# Patient Record
Sex: Female | Born: 1940 | Race: White | Hispanic: No | Marital: Married | Smoking: Never smoker
Health system: Southern US, Community
[De-identification: ages and names within clinical notes are randomized; demographics above are authoritative.]

## PROBLEM LIST (undated history)

## (undated) DIAGNOSIS — C439 Malignant melanoma of skin, unspecified: Secondary | ICD-10-CM

## (undated) DIAGNOSIS — M1990 Unspecified osteoarthritis, unspecified site: Secondary | ICD-10-CM

## (undated) DIAGNOSIS — I2699 Other pulmonary embolism without acute cor pulmonale: Secondary | ICD-10-CM

## (undated) DIAGNOSIS — Z87442 Personal history of urinary calculi: Secondary | ICD-10-CM

## (undated) DIAGNOSIS — I10 Essential (primary) hypertension: Secondary | ICD-10-CM

## (undated) DIAGNOSIS — D6851 Activated protein C resistance: Secondary | ICD-10-CM

## (undated) HISTORY — PX: MELANOMA EXCISION: SHX5266

## (undated) HISTORY — PX: FRACTURE SURGERY: SHX138

## (undated) HISTORY — PX: ANKLE FRACTURE SURGERY: SHX122

## (undated) HISTORY — PX: CYSTOSCOPY W/ STONE MANIPULATION: SHX1427

## (undated) HISTORY — DX: Other pulmonary embolism without acute cor pulmonale: I26.99

## (undated) HISTORY — PX: JOINT REPLACEMENT: SHX530

---

## 1998-03-16 ENCOUNTER — Other Ambulatory Visit: Admission: RE | Admit: 1998-03-16 | Discharge: 1998-03-16 | Payer: Self-pay | Admitting: *Deleted

## 1998-04-13 ENCOUNTER — Ambulatory Visit (HOSPITAL_COMMUNITY): Admission: RE | Admit: 1998-04-13 | Discharge: 1998-04-13 | Payer: Self-pay | Admitting: *Deleted

## 1998-06-10 ENCOUNTER — Encounter: Payer: Self-pay | Admitting: *Deleted

## 1998-06-10 ENCOUNTER — Encounter: Payer: Self-pay | Admitting: Emergency Medicine

## 1998-06-10 ENCOUNTER — Inpatient Hospital Stay (HOSPITAL_COMMUNITY): Admission: EM | Admit: 1998-06-10 | Discharge: 1998-06-11 | Payer: Self-pay | Admitting: Emergency Medicine

## 1999-03-22 ENCOUNTER — Other Ambulatory Visit: Admission: RE | Admit: 1999-03-22 | Discharge: 1999-03-22 | Payer: Self-pay | Admitting: *Deleted

## 1999-04-17 ENCOUNTER — Ambulatory Visit (HOSPITAL_COMMUNITY): Admission: RE | Admit: 1999-04-17 | Discharge: 1999-04-17 | Payer: Self-pay | Admitting: *Deleted

## 2000-04-17 ENCOUNTER — Ambulatory Visit (HOSPITAL_COMMUNITY): Admission: RE | Admit: 2000-04-17 | Discharge: 2000-04-17 | Payer: Self-pay | Admitting: *Deleted

## 2000-04-24 ENCOUNTER — Other Ambulatory Visit: Admission: RE | Admit: 2000-04-24 | Discharge: 2000-04-24 | Payer: Self-pay | Admitting: *Deleted

## 2001-05-05 ENCOUNTER — Ambulatory Visit (HOSPITAL_COMMUNITY): Admission: RE | Admit: 2001-05-05 | Discharge: 2001-05-05 | Payer: Self-pay | Admitting: *Deleted

## 2001-05-07 ENCOUNTER — Other Ambulatory Visit: Admission: RE | Admit: 2001-05-07 | Discharge: 2001-05-07 | Payer: Self-pay | Admitting: *Deleted

## 2001-07-08 HISTORY — PX: ANKLE FRACTURE SURGERY: SHX122

## 2001-12-06 ENCOUNTER — Emergency Department (HOSPITAL_COMMUNITY): Admission: EM | Admit: 2001-12-06 | Discharge: 2001-12-06 | Payer: Self-pay | Admitting: Emergency Medicine

## 2001-12-06 ENCOUNTER — Encounter: Payer: Self-pay | Admitting: Emergency Medicine

## 2001-12-09 ENCOUNTER — Ambulatory Visit (HOSPITAL_BASED_OUTPATIENT_CLINIC_OR_DEPARTMENT_OTHER): Admission: RE | Admit: 2001-12-09 | Discharge: 2001-12-10 | Payer: Self-pay | Admitting: Orthopedic Surgery

## 2001-12-09 ENCOUNTER — Encounter: Payer: Self-pay | Admitting: Orthopedic Surgery

## 2002-04-05 ENCOUNTER — Encounter: Admission: RE | Admit: 2002-04-05 | Discharge: 2002-04-05 | Payer: Self-pay | Admitting: Internal Medicine

## 2002-04-05 ENCOUNTER — Encounter: Payer: Self-pay | Admitting: Internal Medicine

## 2002-05-06 ENCOUNTER — Ambulatory Visit (HOSPITAL_COMMUNITY): Admission: RE | Admit: 2002-05-06 | Discharge: 2002-05-06 | Payer: Self-pay | Admitting: *Deleted

## 2002-05-13 ENCOUNTER — Other Ambulatory Visit: Admission: RE | Admit: 2002-05-13 | Discharge: 2002-05-13 | Payer: Self-pay | Admitting: *Deleted

## 2003-05-09 ENCOUNTER — Ambulatory Visit (HOSPITAL_COMMUNITY): Admission: RE | Admit: 2003-05-09 | Discharge: 2003-05-09 | Payer: Self-pay | Admitting: *Deleted

## 2003-05-17 ENCOUNTER — Other Ambulatory Visit: Admission: RE | Admit: 2003-05-17 | Discharge: 2003-05-17 | Payer: Self-pay | Admitting: *Deleted

## 2004-05-09 ENCOUNTER — Ambulatory Visit (HOSPITAL_COMMUNITY): Admission: RE | Admit: 2004-05-09 | Discharge: 2004-05-09 | Payer: Self-pay | Admitting: *Deleted

## 2005-05-13 ENCOUNTER — Ambulatory Visit (HOSPITAL_COMMUNITY): Admission: RE | Admit: 2005-05-13 | Discharge: 2005-05-13 | Payer: Self-pay | Admitting: *Deleted

## 2006-05-16 ENCOUNTER — Ambulatory Visit (HOSPITAL_COMMUNITY): Admission: RE | Admit: 2006-05-16 | Discharge: 2006-05-16 | Payer: Self-pay | Admitting: *Deleted

## 2007-05-18 ENCOUNTER — Ambulatory Visit (HOSPITAL_COMMUNITY): Admission: RE | Admit: 2007-05-18 | Discharge: 2007-05-18 | Payer: Self-pay | Admitting: Internal Medicine

## 2008-05-18 ENCOUNTER — Ambulatory Visit (HOSPITAL_COMMUNITY): Admission: RE | Admit: 2008-05-18 | Discharge: 2008-05-18 | Payer: Self-pay | Admitting: Internal Medicine

## 2009-05-19 ENCOUNTER — Ambulatory Visit (HOSPITAL_COMMUNITY): Admission: RE | Admit: 2009-05-19 | Discharge: 2009-05-19 | Payer: Self-pay | Admitting: Obstetrics and Gynecology

## 2009-11-28 ENCOUNTER — Ambulatory Visit (HOSPITAL_COMMUNITY): Admission: RE | Admit: 2009-11-28 | Discharge: 2009-11-28 | Payer: Self-pay | Admitting: Obstetrics and Gynecology

## 2010-05-21 ENCOUNTER — Ambulatory Visit (HOSPITAL_COMMUNITY): Admission: RE | Admit: 2010-05-21 | Discharge: 2010-05-21 | Payer: Self-pay | Admitting: Obstetrics and Gynecology

## 2010-11-06 DIAGNOSIS — I2699 Other pulmonary embolism without acute cor pulmonale: Secondary | ICD-10-CM

## 2010-11-06 HISTORY — DX: Other pulmonary embolism without acute cor pulmonale: I26.99

## 2010-11-23 NOTE — Op Note (Signed)
[facility]  Patient:    April Hernandez, April Hernandez Visit Number: [id number] MRN: [id number]          Service Type: DSU Location: [facility] Attending Physician:  Milly Jakob Proc. Date: 12/09/01 Admit Date:  12/09/2001 Discharge Date: 12/09/2001                             Operative Report  PREOPERATIVE DIAGNOSIS:  Trimalleolar ankle fracture.  POSTOPERATIVE DIAGNOSIS:  Trimalleolar ankle fracture.  PROCEDURE PERFORMED:  Open reduction internal fixation of displaced trimalleolar ankle fracture with medial and lateral fixation.  SURGEON:  Harvie Junior, M.D.  ASSISTANT:  Currie Paris. Thedore Mins.  ANESTHESIA:  General.  INDICATION:  The patient is a 70 year old female with a long history of having a fall and she ultimately was evaluated and noted to have a trimalleolar ankle fracture.  We talked about treatment options including open versus closed reduction.  She ultimately elected for open reduction internal fixation and brought to the operating room for this procedure.  DESCRIPTION OF PROCEDURE:  The patient was brought to the operating room and after adequate general anesthesia was obtained, the patient was placed on the operating table and the right leg was prepped and draped in the usual sterile fashion.  Following this, the tourniquet was inflated to 350 mmHg.  Following this, an incision was made both medially and laterally.  Fracture lines were exposed and anatomically reduced on the medial side with two 4.5 mm malleolar screws and parallel and on the lateral side with a plate and screws.  The posterior malleolus reduced anatomically without need for internal fixation. Following fixation on both the medial and lateral side, the patient had her wounds copiously irrigated and suctioned dry, and closed in layers.  A sterile compressive dressing was applied as well as the U and posterior splint.  The patient was admitted for overnight observation and  the following morning discharged home.  The estimated blood loss for the procedure was none. Complications were none. Attending Physician:  Milly Jakob DD:  01/25/02 TD:  01/28/02 Job: [id number]

## 2010-11-26 ENCOUNTER — Inpatient Hospital Stay (HOSPITAL_COMMUNITY)
Admission: EM | Admit: 2010-11-26 | Discharge: 2010-11-28 | DRG: 176 | Disposition: A | Discharge status: Home / Self Care | Payer: Medicare Other | Attending: Internal Medicine | Admitting: Internal Medicine

## 2010-11-26 ENCOUNTER — Emergency Department (HOSPITAL_COMMUNITY): Payer: Medicare Other

## 2010-11-26 DIAGNOSIS — K769 Liver disease, unspecified: Secondary | ICD-10-CM | POA: Diagnosis present

## 2010-11-26 DIAGNOSIS — Z7901 Long term (current) use of anticoagulants: Secondary | ICD-10-CM

## 2010-11-26 DIAGNOSIS — I2699 Other pulmonary embolism without acute cor pulmonale: Principal | ICD-10-CM | POA: Diagnosis present

## 2010-11-26 LAB — POCT I-STAT, CHEM 8
BUN: 27 mg/dL — ABNORMAL HIGH (ref 6–23)
Calcium, Ion: 1.11 mmol/L — ABNORMAL LOW (ref 1.12–1.32)
Chloride: 104 mEq/L (ref 96–112)
Creatinine, Ser: 1 mg/dL (ref 0.4–1.2)
Glucose, Bld: 102 mg/dL — ABNORMAL HIGH (ref 70–99)
HCT: 39 % (ref 36.0–46.0)
Hemoglobin: 13.3 g/dL (ref 12.0–15.0)
Potassium: 3.6 mEq/L (ref 3.5–5.1)
Sodium: 138 mEq/L (ref 135–145)
TCO2: 25 mmol/L (ref 0–100)

## 2010-11-26 LAB — DIFFERENTIAL
Basophils Absolute: 0 10*3/uL (ref 0.0–0.1)
Basophils Relative: 1 % (ref 0–1)
Eosinophils Absolute: 0.3 10*3/uL (ref 0.0–0.7)
Eosinophils Relative: 5 % (ref 0–5)
Lymphocytes Relative: 21 % (ref 12–46)
Lymphs Abs: 1.1 10*3/uL (ref 0.7–4.0)
Monocytes Absolute: 0.4 10*3/uL (ref 0.1–1.0)
Monocytes Relative: 7 % (ref 3–12)
Neutro Abs: 3.6 10*3/uL (ref 1.7–7.7)
Neutrophils Relative %: 66 % (ref 43–77)

## 2010-11-26 LAB — CBC
HCT: 38.5 % (ref 36.0–46.0)
Hemoglobin: 13.1 g/dL (ref 12.0–15.0)
MCH: 28.6 pg (ref 26.0–34.0)
MCHC: 34 g/dL (ref 30.0–36.0)
MCV: 84.1 fL (ref 78.0–100.0)
Platelets: 188 10*3/uL (ref 150–400)
RBC: 4.58 MIL/uL (ref 3.87–5.11)
RDW: 13.3 % (ref 11.5–15.5)
WBC: 5.4 10*3/uL (ref 4.0–10.5)

## 2010-11-26 LAB — COMPREHENSIVE METABOLIC PANEL
ALT: 12 U/L (ref 0–35)
AST: 28 U/L (ref 0–37)
Albumin: 4 g/dL (ref 3.5–5.2)
Alkaline Phosphatase: 46 U/L (ref 39–117)
BUN: 23 mg/dL (ref 6–23)
CO2: 25 mEq/L (ref 19–32)
Calcium: 10.1 mg/dL (ref 8.4–10.5)
Chloride: 101 mEq/L (ref 96–112)
Creatinine, Ser: 0.59 mg/dL (ref 0.4–1.2)
GFR calc Af Amer: >60 mL/min (ref >60)
GFR calc non Af Amer: >60 mL/min (ref >60)
Glucose, Bld: 97 mg/dL (ref 70–99)
Potassium: 3.5 mEq/L (ref 3.5–5.1)
Sodium: 139 mEq/L (ref 135–145)
Total Bilirubin: 0.3 mg/dL (ref 0.3–1.2)
Total Protein: 6.9 g/dL (ref 6.0–8.3)

## 2010-11-26 LAB — POCT CARDIAC MARKERS
CKMB, poc: 1.5 ng/mL (ref 1.0–8.0)
Myoglobin, poc: 69.9 ng/mL (ref 12–200)
Troponin i, poc: <0.05 ng/mL (ref 0.00–0.09)

## 2010-11-26 LAB — PROTIME-INR
INR: 0.97 (ref 0.00–1.49)
Prothrombin Time: 13.1 seconds (ref 11.6–15.2)

## 2010-11-26 LAB — APTT: aPTT: 28 seconds (ref 24–37)

## 2010-11-26 LAB — PRO B NATRIURETIC PEPTIDE: Pro B Natriuretic peptide (BNP): 207.2 pg/mL — ABNORMAL HIGH (ref 0–125)

## 2010-11-26 MED ORDER — IOHEXOL 300 MG/ML  SOLN
100.0000 mL | Freq: Once | INTRAMUSCULAR | Status: AC | PRN
  Administered 2010-11-26: 100 mL via INTRAVENOUS

## 2010-11-27 ENCOUNTER — Other Ambulatory Visit (HOSPITAL_COMMUNITY): Payer: Medicare Other

## 2010-11-27 LAB — CBC
HCT: 34 % — ABNORMAL LOW (ref 36.0–46.0)
Hemoglobin: 11.4 g/dL — ABNORMAL LOW (ref 12.0–15.0)
MCH: 28.2 pg (ref 26.0–34.0)
MCHC: 33.5 g/dL (ref 30.0–36.0)
MCV: 84.2 fL (ref 78.0–100.0)
Platelets: 202 10*3/uL (ref 150–400)
RBC: 4.04 MIL/uL (ref 3.87–5.11)
RDW: 13.6 % (ref 11.5–15.5)
WBC: 5 10*3/uL (ref 4.0–10.5)

## 2010-11-27 LAB — BASIC METABOLIC PANEL
BUN: 21 mg/dL (ref 6–23)
CO2: 30 mEq/L (ref 19–32)
Calcium: 9.2 mg/dL (ref 8.4–10.5)
Chloride: 104 mEq/L (ref 96–112)
Creatinine, Ser: 0.7 mg/dL (ref 0.4–1.2)

## 2010-11-27 LAB — PROTIME-INR
INR: 1.04 (ref 0.00–1.49)
Prothrombin Time: 13.8 seconds (ref 11.6–15.2)

## 2010-11-27 LAB — HEPARIN LEVEL (UNFRACTIONATED): Heparin Unfractionated: 0.53 IU/mL (ref 0.30–0.70)

## 2010-11-28 ENCOUNTER — Inpatient Hospital Stay (HOSPITAL_COMMUNITY): Payer: Medicare Other

## 2010-11-28 LAB — BASIC METABOLIC PANEL
BUN: 17 mg/dL (ref 6–23)
CO2: 28 mEq/L (ref 19–32)
Chloride: 107 mEq/L (ref 96–112)
Glucose, Bld: 85 mg/dL (ref 70–99)
Potassium: 3.8 mEq/L (ref 3.5–5.1)

## 2010-11-28 LAB — CARDIOLIPIN ANTIBODIES, IGG, IGM, IGA
Anticardiolipin IgA: 3 APL U/mL — ABNORMAL LOW (ref <22)
Anticardiolipin IgG: 2 GPL U/mL — ABNORMAL LOW (ref <23)

## 2010-11-28 LAB — PROTEIN C ACTIVITY: Protein C Activity: 137 % — ABNORMAL HIGH (ref 75–133)

## 2010-11-28 LAB — CBC
HCT: 36.3 % (ref 36.0–46.0)
Hemoglobin: 11.9 g/dL — ABNORMAL LOW (ref 12.0–15.0)
MCH: 28 pg (ref 26.0–34.0)
MCHC: 32.8 g/dL (ref 30.0–36.0)

## 2010-11-28 LAB — ANTITHROMBIN III: AntiThromb III Func: 99 % (ref 76–126)

## 2010-11-28 LAB — BETA-2-GLYCOPROTEIN I ABS, IGG/M/A: Beta-2 Glyco I IgG: 0 G Units (ref <20)

## 2010-11-28 LAB — LUPUS ANTICOAGULANT PANEL
DRVVT: 38.1 secs (ref 36.2–44.3)
Lupus Anticoagulant: NOT DETECTED

## 2010-11-28 MED ORDER — GADOBENATE DIMEGLUMINE 529 MG/ML IV SOLN
10.0000 mL | Freq: Once | INTRAVENOUS | Status: AC
  Administered 2010-11-28: 10 mL via INTRAVENOUS

## 2010-11-29 LAB — FACTOR 5 LEIDEN

## 2010-11-30 LAB — PROTHROMBIN GENE MUTATION

## 2010-12-07 NOTE — Discharge Summary (Signed)
NAME:  April Hernandez, April Hernandez NO.:  [id number]  MEDICAL RECORD NO.:  [id number]           PATIENT TYPE:  I  LOCATION:  3701                         FACILITY:  [facility]  PHYSICIAN:  Thora Lance, M.D.  DATE OF BIRTH:  12-Mar-1941  DATE OF ADMISSION:  11/26/2010 DATE OF DISCHARGE:  11/28/2010                              DISCHARGE SUMMARY   REASON FOR ADMISSION:  This is a 70 year old white female who was in good health who presented with a complain of worsening shortness of breath over 4 days prior to admission.  She had been having some mild pleuritic chest pain, but denied any fever, chills, or cough.  The patient was seen by her primary care physician and had a D-dimer sent which was over 10.  She was sent to the ER for evaluation for possible blood clot and had a CT angiogram, which did show segmental and subsegmental pulmonary emboli in all lobes of both lungs.  The patient is generally in good health.  Her father did die suddenly at age 37, and she believes it was of blood clot.  SIGNIFICANT FINDINGS:  VITAL SIGNS:  Temperature 98.4, blood pressure 172/94, heart rate 71, respirations 18, oxygen saturation 98% on room air. LUNGS:  Clear. HEART:  Regular rate and rhythm. ABDOMEN:  Soft, nontender, and nondistended.  No hepatosplenomegaly. EXTREMITIES:  No edema.  No calf tenderness. NEUROLOGIC:  Nonfocal.  LABORATORY DATA:  Sodium 138, potassium 3.6, chloride 104, glucose 102, BUN 27, creatinine 1.0.  CBC:  Hemoglobin 13.1, WBC 5.4, platelet count 188, PT 13.1.  Liver function tests were normal.  BNP 207.  CT angiogram of chest showed segmental and subsegmental pulmonary emboli noted in all lobes of the lungs.  There is a 3 mm nodule in the perforated right middle lobe.  There is an 8.5 x 5.1 x 4.4 cm hypoattenuating mass in the right hepatic lobe.  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  HOSPITAL COURSE:  The patient was admitted for bilateral pulmonary emboli.  She was started on IV heparin and Coumadin.  After 1 day, she was switched to Lovenox.  She remained stable throughout the hospitalization with stable vitals, normal oxygen saturation, and was essentially asymptomatic.  An MRI of the liver and abdomen to evaluate her liver mass and screened for other abnormalities was pending at the time of this dictation.  Hypercoagulability panel was also pending.  DISCHARGE DIAGNOSES: 1. Pulmonary emboli. 2. Liver mass.  PROCEDURES: 1. CT angiogram of the chest. 2. MRI of the liver and abdomen.  DISCHARGE MEDICATIONS: 1. Enoxaparin 80 mg subcu q.24 h. at 8:00 p.m. 2. Warfarin 5 mg 1 tablet every evening.  DISPOSITION:  Discharged to home.  Follow up 2 days.  Dr. Valentina Lucks for checkup PT/INR.  DIET:  Regular diet.  CODE STATUS:  Full code.  ACTIVITY:  No severe exercise.  Can engage in normal activities.          ______________________________ Thora Lance, M.D.  JJG/MEDQ  D:  11/28/2010  T:  11/28/2010  Job:  [id number]  Electronically Signed by Kirby Funk M.D. on 12/07/2010 06:06:50 PM

## 2010-12-15 NOTE — H&P (Signed)
NAME:  April Hernandez, April Hernandez NO.:  [id number]  MEDICAL RECORD NO.:  [id number]           PATIENT TYPE:  E  LOCATION:  [facility]                         FACILITY:  [facility]  PHYSICIAN:  Della Goo, M.D. DATE OF BIRTH:  10-01-1940  DATE OF ADMISSION:  11/26/2010 DATE OF DISCHARGE:                             HISTORY & PHYSICAL   PRIMARY CARE PHYSICIAN:  Deirdre Peer. Polite, MD  CHIEF COMPLAINT:  Shortness of breath.  HISTORY OF PRESENT ILLNESS:  This is a 70 year old female who presents to the emergency department with complaints of worsening shortness of breath over the past 4 days.  She also reports having pleuritic chest pain.  She denies having any fevers, chills, or cough.  The patient called her primary care physician and discussed her symptoms and was advised to report to the emergency department for evaluation of a possible blood clot.  She was evaluated in the emergency department and underwent a CT angiogram study of the chest with contrast which did reveal segmental and subsegmental pulmonary emboli in all lobes of both lungs.  A 3-mm nodule was also seen in the periphery of the right middle lobe.  No pleural effusion or pneumothorax or focal consolidation seen.  The patient did travel by airplane twice this past week, 1 week ago and then 4 days ago on return.  She reports these flights were only an hour and half duration each.  She reports being very active and not being sedentary.  She stated she walked quite a bit while she was in [place]. She denies having any calf pain or any leg swelling.  She is a nonsmoker, and she also denies being on any hormonal treatment.  PAST MEDICAL HISTORY:  None.  MEDICATIONS:  None.  PAST SURGICAL HISTORY:  History of kidney stones which were removed, questionably by lithotripsy.  ALLERGIES:  No known drug allergies.  SOCIAL HISTORY:  The patient is married with three children and they are all healthy.  She is a  nonsmoker.  She reports occasional alcohol usage. Denies any illicit drug usage.  FAMILY HISTORY:  Positive for father dying at age 49 of a blood clot.  REVIEW OF SYSTEMS:  Pertinents are mentioned above.  PHYSICAL EXAMINATION FINDINGS:  GENERAL:  This is a pleasant thin 83- year-old well-nourished, well-developed Caucasian female who is in no acute distress currently. VITAL SIGNS:  Temperature 98.4, blood pressure 172/94, heart rate 71, respirations 18, O2 sats 98%. HEENT:  Normocephalic, atraumatic.  Pupils equally round and reactive to light.  Extraocular movements are intact.  Funduscopic benign.  There is no scleral icterus.  Nares are patent bilaterally.  Oropharynx is clear. NECK:  Supple.  Full range of motion.  No thyromegaly, adenopathy, or jugular venous distention. CARDIOVASCULAR:  Regular rate and rhythm.  No murmurs, gallops, or rubs appreciated. LUNGS:  Clear to auscultation bilaterally.  No rales, rhonchi, or wheezes appreciated. ABDOMEN:  Positive bowel sounds, soft, nontender, nondistended.  No hepatosplenomegaly. EXTREMITIES:  Without cyanosis, clubbing, or edema. NEUROLOGIC:  The patient is alert and oriented x3.  There are no focal deficits on examination. MUSCULOSKELETAL:  Full range of motion of all joints and 5/5 strength and negative Homans sign bilaterally.  ASSESSMENT:  This is a 70 year old female being admitted with: 1. Multiple bilateral pulmonary emboli. 2. Shortness of breath. 3. Pleuritic chest pain secondary to multiple bilateral pulmonary     emboli.  PLAN:  The patient will be admitted to the telemetry area for cardiac monitoring.  The patient has been placed on supplemental oxygen.  A hypercoagulable panel has been ordered and performed, results are pending.  The patient will be started on the IV heparin and Coumadin protocol per pharmacy, and a PT and INR will be checked daily.  Further workup will ensue pending results of the patient's  clinical course and her studies, and the patient is a full code.     Della Goo, M.D.     HJ/MEDQ  D:  11/26/2010  T:  11/26/2010  Job:  [id number]  cc:   Deirdre Peer. Polite, M.D.  Electronically Signed by Della Goo M.D. on 12/15/2010 82:95:62 AM

## 2011-04-17 ENCOUNTER — Other Ambulatory Visit (HOSPITAL_COMMUNITY): Payer: Self-pay | Admitting: Obstetrics & Gynecology

## 2011-04-17 DIAGNOSIS — Z1231 Encounter for screening mammogram for malignant neoplasm of breast: Secondary | ICD-10-CM

## 2011-05-09 ENCOUNTER — Encounter: Payer: Medicare Other | Admitting: Oncology

## 2011-05-10 ENCOUNTER — Telehealth: Payer: Self-pay | Admitting: Oncology

## 2011-05-10 NOTE — Telephone Encounter (Signed)
Pt called to confirm her appt with dr Truett Perna for 06/07/2011 with dr Truett Perna

## 2011-05-23 ENCOUNTER — Ambulatory Visit (HOSPITAL_COMMUNITY)
Admission: RE | Admit: 2011-05-23 | Discharge: 2011-05-23 | Disposition: A | Discharge status: Home / Self Care | Payer: Medicare Other | Source: Ambulatory Visit | Attending: Obstetrics & Gynecology | Admitting: Obstetrics & Gynecology

## 2011-05-23 DIAGNOSIS — Z1231 Encounter for screening mammogram for malignant neoplasm of breast: Secondary | ICD-10-CM | POA: Insufficient documentation

## 2011-06-07 ENCOUNTER — Ambulatory Visit (HOSPITAL_BASED_OUTPATIENT_CLINIC_OR_DEPARTMENT_OTHER): Payer: Medicare Other | Admitting: Oncology

## 2011-06-07 ENCOUNTER — Encounter: Payer: Self-pay | Admitting: Oncology

## 2011-06-07 ENCOUNTER — Other Ambulatory Visit: Payer: Medicare Other

## 2011-06-07 ENCOUNTER — Ambulatory Visit: Payer: Medicare Other

## 2011-06-07 DIAGNOSIS — M7989 Other specified soft tissue disorders: Secondary | ICD-10-CM

## 2011-06-07 DIAGNOSIS — D6859 Other primary thrombophilia: Secondary | ICD-10-CM

## 2011-06-07 DIAGNOSIS — I2699 Other pulmonary embolism without acute cor pulmonale: Secondary | ICD-10-CM | POA: Insufficient documentation

## 2011-06-07 NOTE — Progress Notes (Signed)
Referring MD: Merryl Hacker 70 y.o.  1940-08-19    Reason for Referral: Factor V Leiden heterozygote with a history of pulmonary embolism.   HPI:   She reports an episode of dyspnea while walking her dog in May. She then became tired while gardening. She was seen in urgent care a d-dimer returned elevated. She was referred to the emergency room and a CT of the chest confirmed segmental and subsegmental pulmonary emboli to all lobes of both lungs. A hypoattenuating mass was noted in the right liver concerning for a hepatic adenoma. An MRI of the liver on may 23rd confirmed 2 liver lesions. Both had features of benign hemangiomas.  She was admitted and treated with intravenous heparin followed by Lovenox and Coumadin anticoagulation. She continues Coumadin anticoagulation. Rippon obtained a hypercoagulable panel and she was found to be a heterozygote for the factor V Leiden mutation.  She reports no previous history of venous or arterial thrombosis. She had a single miscarriage. She reports no trauma, immobility, or surgery prior to the pulmonary embolism in May. She does not believe a Doppler evaluation was obtained.  Her father died of an acute pulmonary embolism at age 30.  Past Medical History  Diagnosis Date  . Pulmonary embolism 11/2010   . 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 Osteoarthritis  . Nephrolithiasis, left ureteral calculus December 1999  . History of elevated cholesterol  . Osteoporosis  . Health maintenance-reports being up-to-date on mammography and colonoscopy screening Past surgical history: Right ankle surgery following trauma 9-10 years ago  Family history: Her father died of a pulmonary embolus at age 11. A grandfather had strokes. No other family history of venous or arterial thrombosis. She has one sister.  Social History: She lives with her husband and [place]. She does not use tobacco. She drinks wine occasionally. She has no  transfusion history. She denies respected for HIV and hepatitis.     Allergies: No Known Allergies  No current outpatient prescriptions on file as of 06/07/2011.   No current facility-administered medications on file as of 06/07/2011.    ROS:   Positives include: She has "arthritis "involving the fingers, a left toe, and the left knee.  A complete ROS was otherwise negative.  Physical Exam:  Blood pressure 169/81, pulse 72, temperature 97.5 F (36.4 C), temperature source Oral, height 5' 1.5" (1.562 m), weight 116 lb 9.6 oz (52.889 kg).  HEENT: Oropharynx without mass. Neck without mass. Lungs: Clear bilaterally Cardiac: Regular rate and rhythm Abdomen: No hepatosplenomegaly. No mass. Vascular: The left lower leg is larger than the right side. No erythema, edema, or palpable cord. Lymph nodes: No cervical, supraclavicular, axillary, or inguinal nodes Neurologic: Alert and oriented. The motor exam appears grossly intact. Skin: Small areas of dry erythema over the extremities. Musculoskeletal: Synovial hypertrophy at the finger joints of both hands. Soft 2 cm cutaneous lesion at the left calf   LAB:  Hypercoagulation panel - May 21st 2012-antithrombin III 99%, protein C 101%, protein S 110%, protein C functional 137%, protein S functional 81%, lupus anticoagulant-not detected, beta-2 glycoprotein IgG 0, beta-2 glycoprotein IgM 2, beta-2 glycoprotein IgA 3, Cardiolipin IgG 2, cardiolipin IgM 2, cardiolipin IgA 3, factor V Leiden mutation-heterozygous, prothrombin gene mutation-negative  CBC on 11/26/2010-hemoglobin 13.1, platelet 188,000, white count 5.4, ANC 3.6    Assessment/Plan:   #1. Bilateral pulmonary embolism May 2012  #2. Factor V Leiden heterozygote  #3. Family history of a fatal pulmonary  embolism  #4. Hepatic hemangiomas  #5. Degenerative arthritis    Disposition:   Ms. April Hernandez was diagnosed with bilateral pulmonary emboli in May of 2012. She has  completed 6 months of warfarin anticoagulation. She is referred to consider the indication for continuing anticoagulation therapy. She is a heterozygote for the factor V Leiden mutation.  I discussed  inherited thrombophilia with Ms. Leanne Chang. We specifically discussed the increased risk of venous thromboembolic disease in patients with the factor V Leiden mutation. She appears to have had an unprovoked episode of pulmonary emboli. There is no clinical evidence for an acquired condition associated with venous thrombosis. I think it would be reasonable to discontinue anticoagulation therapy at this point, but I am concerned she may have a significant risk for recurrent venous thrombosis. This is due to her personal history of an unprovoked pulmonary embolism and her father's history.  She will remain on warfarin. We decided to make a referral to the thrombophilia clinic at [facility].  No source for pulmonary embolism was found. Her left leg is slightly enlarged on exam today. I recommended a Doppler of evaluation of the lower extremities. She declined this today and stated she could have this testing at [facility].  I did not schedule a followup appointment in the hematology clinic. We can see her again depending on the [facility] recommendations.    Jacia Sickman BRADLEY 06/07/2011, 6:21 PM

## 2011-07-30 DIAGNOSIS — H5200 Hypermetropia, unspecified eye: Secondary | ICD-10-CM | POA: Diagnosis not present

## 2011-07-30 DIAGNOSIS — H35379 Puckering of macula, unspecified eye: Secondary | ICD-10-CM | POA: Diagnosis not present

## 2011-07-30 DIAGNOSIS — H52229 Regular astigmatism, unspecified eye: Secondary | ICD-10-CM | POA: Diagnosis not present

## 2011-07-30 DIAGNOSIS — H524 Presbyopia: Secondary | ICD-10-CM | POA: Diagnosis not present

## 2011-07-31 DIAGNOSIS — I2699 Other pulmonary embolism without acute cor pulmonale: Secondary | ICD-10-CM | POA: Diagnosis not present

## 2011-08-01 DIAGNOSIS — I2699 Other pulmonary embolism without acute cor pulmonale: Secondary | ICD-10-CM | POA: Diagnosis not present

## 2011-08-01 DIAGNOSIS — D6859 Other primary thrombophilia: Secondary | ICD-10-CM | POA: Diagnosis not present

## 2011-08-01 DIAGNOSIS — Z5181 Encounter for therapeutic drug level monitoring: Secondary | ICD-10-CM | POA: Diagnosis not present

## 2011-08-01 DIAGNOSIS — Z7901 Long term (current) use of anticoagulants: Secondary | ICD-10-CM | POA: Diagnosis not present

## 2011-08-01 DIAGNOSIS — Z86711 Personal history of pulmonary embolism: Secondary | ICD-10-CM | POA: Diagnosis not present

## 2011-08-21 DIAGNOSIS — M1990 Unspecified osteoarthritis, unspecified site: Secondary | ICD-10-CM | POA: Diagnosis not present

## 2011-08-21 DIAGNOSIS — M2550 Pain in unspecified joint: Secondary | ICD-10-CM | POA: Diagnosis not present

## 2011-08-21 DIAGNOSIS — M719 Bursopathy, unspecified: Secondary | ICD-10-CM | POA: Diagnosis not present

## 2011-08-21 DIAGNOSIS — Z7901 Long term (current) use of anticoagulants: Secondary | ICD-10-CM | POA: Diagnosis not present

## 2011-09-02 DIAGNOSIS — R5381 Other malaise: Secondary | ICD-10-CM | POA: Diagnosis not present

## 2011-09-02 DIAGNOSIS — R030 Elevated blood-pressure reading, without diagnosis of hypertension: Secondary | ICD-10-CM | POA: Diagnosis not present

## 2011-09-10 DIAGNOSIS — M1990 Unspecified osteoarthritis, unspecified site: Secondary | ICD-10-CM | POA: Diagnosis not present

## 2011-09-10 DIAGNOSIS — M2550 Pain in unspecified joint: Secondary | ICD-10-CM | POA: Diagnosis not present

## 2011-09-19 DIAGNOSIS — Z7901 Long term (current) use of anticoagulants: Secondary | ICD-10-CM | POA: Diagnosis not present

## 2011-09-30 DIAGNOSIS — I10 Essential (primary) hypertension: Secondary | ICD-10-CM | POA: Diagnosis not present

## 2011-10-17 DIAGNOSIS — Z7901 Long term (current) use of anticoagulants: Secondary | ICD-10-CM | POA: Diagnosis not present

## 2011-11-07 DIAGNOSIS — T7800XA Anaphylactic reaction due to unspecified food, initial encounter: Secondary | ICD-10-CM | POA: Diagnosis not present

## 2011-11-11 DIAGNOSIS — Z7901 Long term (current) use of anticoagulants: Secondary | ICD-10-CM | POA: Diagnosis not present

## 2011-11-27 DIAGNOSIS — Z124 Encounter for screening for malignant neoplasm of cervix: Secondary | ICD-10-CM | POA: Diagnosis not present

## 2011-11-27 DIAGNOSIS — Z01419 Encounter for gynecological examination (general) (routine) without abnormal findings: Secondary | ICD-10-CM | POA: Diagnosis not present

## 2011-12-03 DIAGNOSIS — Z0000 Encounter for general adult medical examination without abnormal findings: Secondary | ICD-10-CM | POA: Diagnosis not present

## 2011-12-03 DIAGNOSIS — R030 Elevated blood-pressure reading, without diagnosis of hypertension: Secondary | ICD-10-CM | POA: Diagnosis not present

## 2011-12-03 DIAGNOSIS — Z136 Encounter for screening for cardiovascular disorders: Secondary | ICD-10-CM | POA: Diagnosis not present

## 2011-12-03 DIAGNOSIS — Z7901 Long term (current) use of anticoagulants: Secondary | ICD-10-CM | POA: Diagnosis not present

## 2011-12-03 DIAGNOSIS — Z1331 Encounter for screening for depression: Secondary | ICD-10-CM | POA: Diagnosis not present

## 2011-12-31 DIAGNOSIS — Z7901 Long term (current) use of anticoagulants: Secondary | ICD-10-CM | POA: Diagnosis not present

## 2012-01-23 DIAGNOSIS — Z7901 Long term (current) use of anticoagulants: Secondary | ICD-10-CM | POA: Diagnosis not present

## 2012-01-23 DIAGNOSIS — I10 Essential (primary) hypertension: Secondary | ICD-10-CM | POA: Diagnosis not present

## 2012-02-13 DIAGNOSIS — Z7901 Long term (current) use of anticoagulants: Secondary | ICD-10-CM | POA: Diagnosis not present

## 2012-02-13 DIAGNOSIS — I10 Essential (primary) hypertension: Secondary | ICD-10-CM | POA: Diagnosis not present

## 2012-03-13 DIAGNOSIS — Z7901 Long term (current) use of anticoagulants: Secondary | ICD-10-CM | POA: Diagnosis not present

## 2012-04-09 DIAGNOSIS — Z23 Encounter for immunization: Secondary | ICD-10-CM | POA: Diagnosis not present

## 2012-04-09 DIAGNOSIS — Z7901 Long term (current) use of anticoagulants: Secondary | ICD-10-CM | POA: Diagnosis not present

## 2012-04-14 ENCOUNTER — Other Ambulatory Visit (HOSPITAL_COMMUNITY): Payer: Self-pay | Admitting: Internal Medicine

## 2012-04-14 DIAGNOSIS — Z1231 Encounter for screening mammogram for malignant neoplasm of breast: Secondary | ICD-10-CM

## 2012-04-15 ENCOUNTER — Other Ambulatory Visit (HOSPITAL_COMMUNITY): Payer: Self-pay | Admitting: Internal Medicine

## 2012-04-16 ENCOUNTER — Other Ambulatory Visit (HOSPITAL_COMMUNITY): Payer: Self-pay | Admitting: Internal Medicine

## 2012-04-16 DIAGNOSIS — M810 Age-related osteoporosis without current pathological fracture: Secondary | ICD-10-CM

## 2012-05-07 DIAGNOSIS — Z7901 Long term (current) use of anticoagulants: Secondary | ICD-10-CM | POA: Diagnosis not present

## 2012-05-26 ENCOUNTER — Ambulatory Visit (HOSPITAL_COMMUNITY)
Admission: RE | Admit: 2012-05-26 | Discharge: 2012-05-26 | Disposition: A | Discharge status: Home / Self Care | Payer: Medicare Other | Source: Ambulatory Visit | Attending: Internal Medicine | Admitting: Internal Medicine

## 2012-05-26 DIAGNOSIS — M810 Age-related osteoporosis without current pathological fracture: Secondary | ICD-10-CM

## 2012-05-26 DIAGNOSIS — Z1231 Encounter for screening mammogram for malignant neoplasm of breast: Secondary | ICD-10-CM

## 2012-05-26 DIAGNOSIS — M899 Disorder of bone, unspecified: Secondary | ICD-10-CM | POA: Diagnosis not present

## 2012-05-26 DIAGNOSIS — M949 Disorder of cartilage, unspecified: Secondary | ICD-10-CM | POA: Diagnosis not present

## 2012-05-29 ENCOUNTER — Other Ambulatory Visit: Payer: Self-pay | Admitting: Internal Medicine

## 2012-05-29 DIAGNOSIS — R928 Other abnormal and inconclusive findings on diagnostic imaging of breast: Secondary | ICD-10-CM

## 2012-06-09 DIAGNOSIS — Z7901 Long term (current) use of anticoagulants: Secondary | ICD-10-CM | POA: Diagnosis not present

## 2012-06-10 ENCOUNTER — Ambulatory Visit
Admission: RE | Admit: 2012-06-10 | Discharge: 2012-06-10 | Disposition: A | Discharge status: Home / Self Care | Payer: Medicare Other | Source: Ambulatory Visit | Attending: Internal Medicine | Admitting: Internal Medicine

## 2012-06-10 DIAGNOSIS — R928 Other abnormal and inconclusive findings on diagnostic imaging of breast: Secondary | ICD-10-CM | POA: Diagnosis not present

## 2012-06-23 DIAGNOSIS — I10 Essential (primary) hypertension: Secondary | ICD-10-CM | POA: Diagnosis not present

## 2012-07-09 DIAGNOSIS — Z7901 Long term (current) use of anticoagulants: Secondary | ICD-10-CM | POA: Diagnosis not present

## 2012-08-04 DIAGNOSIS — H524 Presbyopia: Secondary | ICD-10-CM | POA: Diagnosis not present

## 2012-08-04 DIAGNOSIS — H5200 Hypermetropia, unspecified eye: Secondary | ICD-10-CM | POA: Diagnosis not present

## 2012-08-04 DIAGNOSIS — H52229 Regular astigmatism, unspecified eye: Secondary | ICD-10-CM | POA: Diagnosis not present

## 2012-08-04 DIAGNOSIS — H2510 Age-related nuclear cataract, unspecified eye: Secondary | ICD-10-CM | POA: Diagnosis not present

## 2012-08-06 DIAGNOSIS — Z7901 Long term (current) use of anticoagulants: Secondary | ICD-10-CM | POA: Diagnosis not present

## 2012-09-01 DIAGNOSIS — Z7901 Long term (current) use of anticoagulants: Secondary | ICD-10-CM | POA: Diagnosis not present

## 2012-09-29 DIAGNOSIS — Z7901 Long term (current) use of anticoagulants: Secondary | ICD-10-CM | POA: Diagnosis not present

## 2012-10-27 DIAGNOSIS — Z7901 Long term (current) use of anticoagulants: Secondary | ICD-10-CM | POA: Diagnosis not present

## 2012-11-23 DIAGNOSIS — Z7901 Long term (current) use of anticoagulants: Secondary | ICD-10-CM | POA: Diagnosis not present

## 2012-12-08 DIAGNOSIS — Z7901 Long term (current) use of anticoagulants: Secondary | ICD-10-CM | POA: Diagnosis not present

## 2012-12-22 DIAGNOSIS — I10 Essential (primary) hypertension: Secondary | ICD-10-CM | POA: Diagnosis not present

## 2013-01-05 DIAGNOSIS — Z7901 Long term (current) use of anticoagulants: Secondary | ICD-10-CM | POA: Diagnosis not present

## 2013-01-14 DIAGNOSIS — M79609 Pain in unspecified limb: Secondary | ICD-10-CM | POA: Diagnosis not present

## 2013-01-14 DIAGNOSIS — L255 Unspecified contact dermatitis due to plants, except food: Secondary | ICD-10-CM | POA: Diagnosis not present

## 2013-02-02 DIAGNOSIS — Z7901 Long term (current) use of anticoagulants: Secondary | ICD-10-CM | POA: Diagnosis not present

## 2013-03-09 DIAGNOSIS — Z7901 Long term (current) use of anticoagulants: Secondary | ICD-10-CM | POA: Diagnosis not present

## 2013-03-10 DIAGNOSIS — H6690 Otitis media, unspecified, unspecified ear: Secondary | ICD-10-CM | POA: Diagnosis not present

## 2013-03-10 DIAGNOSIS — H6120 Impacted cerumen, unspecified ear: Secondary | ICD-10-CM | POA: Diagnosis not present

## 2013-03-10 DIAGNOSIS — J029 Acute pharyngitis, unspecified: Secondary | ICD-10-CM | POA: Diagnosis not present

## 2013-04-06 DIAGNOSIS — Z7901 Long term (current) use of anticoagulants: Secondary | ICD-10-CM | POA: Diagnosis not present

## 2013-04-12 ENCOUNTER — Other Ambulatory Visit (HOSPITAL_COMMUNITY): Payer: Self-pay | Admitting: Internal Medicine

## 2013-04-12 DIAGNOSIS — Z1231 Encounter for screening mammogram for malignant neoplasm of breast: Secondary | ICD-10-CM

## 2013-05-04 DIAGNOSIS — Z7901 Long term (current) use of anticoagulants: Secondary | ICD-10-CM | POA: Diagnosis not present

## 2013-05-04 DIAGNOSIS — Z23 Encounter for immunization: Secondary | ICD-10-CM | POA: Diagnosis not present

## 2013-05-04 DIAGNOSIS — D6859 Other primary thrombophilia: Secondary | ICD-10-CM | POA: Diagnosis not present

## 2013-05-27 ENCOUNTER — Ambulatory Visit (HOSPITAL_COMMUNITY)
Admission: RE | Admit: 2013-05-27 | Discharge: 2013-05-27 | Disposition: A | Discharge status: Home / Self Care | Payer: Medicare Other | Source: Ambulatory Visit | Attending: Internal Medicine | Admitting: Internal Medicine

## 2013-05-27 DIAGNOSIS — Z1231 Encounter for screening mammogram for malignant neoplasm of breast: Secondary | ICD-10-CM

## 2013-06-08 DIAGNOSIS — I2699 Other pulmonary embolism without acute cor pulmonale: Secondary | ICD-10-CM | POA: Diagnosis not present

## 2013-06-08 DIAGNOSIS — Z7901 Long term (current) use of anticoagulants: Secondary | ICD-10-CM | POA: Diagnosis not present

## 2013-06-15 DIAGNOSIS — Z1331 Encounter for screening for depression: Secondary | ICD-10-CM | POA: Diagnosis not present

## 2013-06-15 DIAGNOSIS — I10 Essential (primary) hypertension: Secondary | ICD-10-CM | POA: Diagnosis not present

## 2013-07-06 DIAGNOSIS — Z7901 Long term (current) use of anticoagulants: Secondary | ICD-10-CM | POA: Diagnosis not present

## 2013-07-06 DIAGNOSIS — I2699 Other pulmonary embolism without acute cor pulmonale: Secondary | ICD-10-CM | POA: Diagnosis not present

## 2013-08-03 DIAGNOSIS — I2699 Other pulmonary embolism without acute cor pulmonale: Secondary | ICD-10-CM | POA: Diagnosis not present

## 2013-08-03 DIAGNOSIS — Z7901 Long term (current) use of anticoagulants: Secondary | ICD-10-CM | POA: Diagnosis not present

## 2013-09-06 DIAGNOSIS — I2699 Other pulmonary embolism without acute cor pulmonale: Secondary | ICD-10-CM | POA: Diagnosis not present

## 2013-09-06 DIAGNOSIS — Z7901 Long term (current) use of anticoagulants: Secondary | ICD-10-CM | POA: Diagnosis not present

## 2013-10-04 DIAGNOSIS — I2699 Other pulmonary embolism without acute cor pulmonale: Secondary | ICD-10-CM | POA: Diagnosis not present

## 2013-10-04 DIAGNOSIS — Z7901 Long term (current) use of anticoagulants: Secondary | ICD-10-CM | POA: Diagnosis not present

## 2013-11-02 DIAGNOSIS — I2699 Other pulmonary embolism without acute cor pulmonale: Secondary | ICD-10-CM | POA: Diagnosis not present

## 2013-11-02 DIAGNOSIS — Z7901 Long term (current) use of anticoagulants: Secondary | ICD-10-CM | POA: Diagnosis not present

## 2013-11-10 DIAGNOSIS — M25469 Effusion, unspecified knee: Secondary | ICD-10-CM | POA: Diagnosis not present

## 2013-11-22 ENCOUNTER — Other Ambulatory Visit: Payer: Self-pay | Admitting: Internal Medicine

## 2013-11-22 ENCOUNTER — Ambulatory Visit
Admission: RE | Admit: 2013-11-22 | Discharge: 2013-11-22 | Disposition: A | Discharge status: Home / Self Care | Payer: Medicare Other | Source: Ambulatory Visit | Attending: Internal Medicine | Admitting: Internal Medicine

## 2013-11-22 DIAGNOSIS — M25569 Pain in unspecified knee: Secondary | ICD-10-CM | POA: Diagnosis not present

## 2013-11-22 DIAGNOSIS — M1710 Unilateral primary osteoarthritis, unspecified knee: Secondary | ICD-10-CM | POA: Diagnosis not present

## 2013-11-22 DIAGNOSIS — IMO0002 Reserved for concepts with insufficient information to code with codable children: Secondary | ICD-10-CM | POA: Diagnosis not present

## 2013-11-25 DIAGNOSIS — M25569 Pain in unspecified knee: Secondary | ICD-10-CM | POA: Diagnosis not present

## 2013-12-09 DIAGNOSIS — M1710 Unilateral primary osteoarthritis, unspecified knee: Secondary | ICD-10-CM | POA: Diagnosis not present

## 2013-12-09 DIAGNOSIS — M25569 Pain in unspecified knee: Secondary | ICD-10-CM | POA: Diagnosis not present

## 2013-12-21 DIAGNOSIS — R5383 Other fatigue: Secondary | ICD-10-CM | POA: Diagnosis not present

## 2013-12-21 DIAGNOSIS — R5381 Other malaise: Secondary | ICD-10-CM | POA: Diagnosis not present

## 2013-12-21 DIAGNOSIS — I2699 Other pulmonary embolism without acute cor pulmonale: Secondary | ICD-10-CM | POA: Diagnosis not present

## 2013-12-21 DIAGNOSIS — Z7901 Long term (current) use of anticoagulants: Secondary | ICD-10-CM | POA: Diagnosis not present

## 2013-12-21 DIAGNOSIS — B9789 Other viral agents as the cause of diseases classified elsewhere: Secondary | ICD-10-CM | POA: Diagnosis not present

## 2013-12-23 DIAGNOSIS — Z7901 Long term (current) use of anticoagulants: Secondary | ICD-10-CM | POA: Diagnosis not present

## 2013-12-23 DIAGNOSIS — Z0000 Encounter for general adult medical examination without abnormal findings: Secondary | ICD-10-CM | POA: Diagnosis not present

## 2013-12-23 DIAGNOSIS — I10 Essential (primary) hypertension: Secondary | ICD-10-CM | POA: Diagnosis not present

## 2013-12-23 DIAGNOSIS — Z1331 Encounter for screening for depression: Secondary | ICD-10-CM | POA: Diagnosis not present

## 2013-12-23 DIAGNOSIS — I2699 Other pulmonary embolism without acute cor pulmonale: Secondary | ICD-10-CM | POA: Diagnosis not present

## 2013-12-30 DIAGNOSIS — Z86711 Personal history of pulmonary embolism: Secondary | ICD-10-CM | POA: Diagnosis not present

## 2013-12-30 DIAGNOSIS — Z7901 Long term (current) use of anticoagulants: Secondary | ICD-10-CM | POA: Diagnosis not present

## 2013-12-30 DIAGNOSIS — R5381 Other malaise: Secondary | ICD-10-CM | POA: Diagnosis not present

## 2013-12-30 DIAGNOSIS — R5383 Other fatigue: Secondary | ICD-10-CM | POA: Diagnosis not present

## 2014-01-03 DIAGNOSIS — R5383 Other fatigue: Secondary | ICD-10-CM | POA: Diagnosis not present

## 2014-01-03 DIAGNOSIS — R5381 Other malaise: Secondary | ICD-10-CM | POA: Diagnosis not present

## 2014-01-04 ENCOUNTER — Ambulatory Visit
Admission: RE | Admit: 2014-01-04 | Discharge: 2014-01-04 | Disposition: A | Discharge status: Home / Self Care | Payer: Medicare Other | Source: Ambulatory Visit | Attending: Internal Medicine | Admitting: Internal Medicine

## 2014-01-04 ENCOUNTER — Other Ambulatory Visit: Payer: Self-pay | Admitting: Internal Medicine

## 2014-01-04 DIAGNOSIS — R0602 Shortness of breath: Secondary | ICD-10-CM

## 2014-01-04 DIAGNOSIS — R5381 Other malaise: Secondary | ICD-10-CM | POA: Diagnosis not present

## 2014-01-06 ENCOUNTER — Emergency Department (HOSPITAL_COMMUNITY)
Admission: EM | Admit: 2014-01-06 | Discharge: 2014-01-06 | Disposition: A | Discharge status: Home / Self Care | Payer: Medicare Other | Attending: Emergency Medicine | Admitting: Emergency Medicine

## 2014-01-06 ENCOUNTER — Encounter (HOSPITAL_COMMUNITY): Payer: Self-pay | Admitting: Emergency Medicine

## 2014-01-06 ENCOUNTER — Other Ambulatory Visit: Payer: Self-pay

## 2014-01-06 ENCOUNTER — Emergency Department (HOSPITAL_COMMUNITY): Payer: Medicare Other

## 2014-01-06 DIAGNOSIS — Z79899 Other long term (current) drug therapy: Secondary | ICD-10-CM | POA: Insufficient documentation

## 2014-01-06 DIAGNOSIS — Z86711 Personal history of pulmonary embolism: Secondary | ICD-10-CM | POA: Diagnosis not present

## 2014-01-06 DIAGNOSIS — R0600 Dyspnea, unspecified: Secondary | ICD-10-CM

## 2014-01-06 DIAGNOSIS — I10 Essential (primary) hypertension: Secondary | ICD-10-CM | POA: Diagnosis not present

## 2014-01-06 DIAGNOSIS — R0602 Shortness of breath: Secondary | ICD-10-CM | POA: Diagnosis not present

## 2014-01-06 DIAGNOSIS — R0989 Other specified symptoms and signs involving the circulatory and respiratory systems: Principal | ICD-10-CM | POA: Insufficient documentation

## 2014-01-06 DIAGNOSIS — Z862 Personal history of diseases of the blood and blood-forming organs and certain disorders involving the immune mechanism: Secondary | ICD-10-CM | POA: Diagnosis not present

## 2014-01-06 DIAGNOSIS — R0609 Other forms of dyspnea: Secondary | ICD-10-CM | POA: Insufficient documentation

## 2014-01-06 DIAGNOSIS — R911 Solitary pulmonary nodule: Secondary | ICD-10-CM | POA: Diagnosis not present

## 2014-01-06 HISTORY — DX: Essential (primary) hypertension: I10

## 2014-01-06 HISTORY — DX: Activated protein C resistance: D68.51

## 2014-01-06 LAB — CBC WITH DIFFERENTIAL/PLATELET
BASOS PCT: 1 % (ref 0–1)
Basophils Absolute: 0.1 10*3/uL (ref 0.0–0.1)
Eosinophils Absolute: 0 10*3/uL (ref 0.0–0.7)
Eosinophils Relative: 0 % (ref 0–5)
HCT: 39.3 % (ref 36.0–46.0)
Hemoglobin: 13.1 g/dL (ref 12.0–15.0)
LYMPHS ABS: 1.7 10*3/uL (ref 0.7–4.0)
Lymphocytes Relative: 38 % (ref 12–46)
MCH: 28.3 pg (ref 26.0–34.0)
MCHC: 33.3 g/dL (ref 30.0–36.0)
MCV: 84.9 fL (ref 78.0–100.0)
Monocytes Absolute: 0.5 10*3/uL (ref 0.1–1.0)
Monocytes Relative: 10 % (ref 3–12)
Neutro Abs: 2.3 10*3/uL (ref 1.7–7.7)
Neutrophils Relative %: 51 % (ref 43–77)
Platelets: 212 10*3/uL (ref 150–400)
RBC: 4.63 MIL/uL (ref 3.87–5.11)
RDW: 13.7 % (ref 11.5–15.5)
WBC: 4.5 10*3/uL (ref 4.0–10.5)

## 2014-01-06 LAB — BASIC METABOLIC PANEL
ANION GAP: 20 — AB (ref 5–15)
BUN: 13 mg/dL (ref 6–23)
CO2: 22 mEq/L (ref 19–32)
Calcium: 10.4 mg/dL (ref 8.4–10.5)
Chloride: 98 mEq/L (ref 96–112)
Creatinine, Ser: 0.8 mg/dL (ref 0.50–1.10)
GFR, EST AFRICAN AMERICAN: 84 mL/min — AB (ref >90)
GFR, EST NON AFRICAN AMERICAN: 72 mL/min — AB (ref >90)
GLUCOSE: 109 mg/dL — AB (ref 70–99)
Potassium: 3.8 mEq/L (ref 3.7–5.3)
Sodium: 140 mEq/L (ref 137–147)

## 2014-01-06 LAB — PROTIME-INR
INR: 2.23 — ABNORMAL HIGH (ref 0.00–1.49)
PROTHROMBIN TIME: 24.7 s — AB (ref 11.6–15.2)

## 2014-01-06 LAB — TROPONIN I: Troponin I: <0.3 ng/mL (ref <0.30)

## 2014-01-06 MED ORDER — IOHEXOL 350 MG/ML SOLN
100.0000 mL | Freq: Once | INTRAVENOUS | Status: AC | PRN
  Administered 2014-01-06: 100 mL via INTRAVENOUS

## 2014-01-06 NOTE — ED Notes (Signed)
Dr. Wentz at the bedside.  

## 2014-01-06 NOTE — ED Notes (Signed)
Pt here with increasing SOB and fatigue over the past few weeks. Has been seen by her PCP and multiple tests ran. Pt is on coumadin and was recently given vitamin K due to blood being so thin. sts after is when the SOB started. Denies chest pain. Pt hx of clotting disorder.

## 2014-01-06 NOTE — ED Provider Notes (Signed)
CSN: [id number]     Arrival date & time 01/06/14  1250 History   First MD Initiated Contact with Patient 01/06/14 1332     Chief Complaint  Patient presents with  . Shortness of Breath     (Consider location/radiation/quality/duration/timing/severity/associated sxs/prior Treatment) HPI  April Hernandez is a 73 y.o. female here for evaluation of gradually worsening shortness of breath for 2 weeks. She has not had ongoing cough, chest discomfort, nausea, vomiting, fever, chills, or back pain. Her INR level was adjusted last week, when it was found to be high. She was treated with vitamin K 5 mg x1, and taken off Coumadin for 2 days. She was restarted at her regular dose. Her INR was 2.2, 2 days ago. She has had decreased appetite for several weeks and is losing some weight. She denies stress. She's never had this happen previously. She has a history of multiple pulmonary emboli. There are no other known modifying factors   Past Medical History  Diagnosis Date  . Pulmonary embolism 11/2010  . Factor 5 Leiden mutation, heterozygous   . Hypertension    History reviewed. No pertinent past surgical history. History reviewed. No pertinent family history. History  Substance Use Topics  . Smoking status: Never Smoker   . Smokeless tobacco: Not on file  . Alcohol Use: No   OB History   Grav Para Term Preterm Abortions TAB SAB Ect Mult Living                 Review of Systems  All other systems reviewed and are negative.     Allergies  Review of patient's allergies indicates no known allergies.  Home Medications   Prior to Admission medications   Medication Sig Start Date End Date Taking? Authorizing Provider  acetaminophen (TYLENOL) 325 MG tablet Take 650 mg by mouth every 6 (six) hours as needed.     Yes Historical Provider, MD  amLODipine (NORVASC) 5 MG tablet Take 5 mg by mouth daily.   Yes Historical Provider, MD  Cholecalciferol (VITAMIN D) 1000 UNITS capsule Take 1,000 Units  by mouth daily.     Yes Historical Provider, MD  Glucosamine-Chondroit-Vit C-Mn (GLUCOSAMINE CHONDR 1500 COMPLX PO) Take 2 tablets by mouth daily.     Yes Historical Provider, MD  IRON CR PO Take 1 tablet by mouth daily.     Yes Historical Provider, MD  Multiple Vitamins-Minerals (CENTRUM SILVER PO) Take 1 tablet by mouth daily.     Yes Historical Provider, MD  OVER THE COUNTER MEDICATION Take 1 tablet by mouth 2 (two) times daily. Omega XL   Yes Historical Provider, MD  warfarin (COUMADIN) 5 MG tablet Take 5 mg by mouth daily. On Wednesday and Saturday take 7.5mg . All other days take 5mg .   Yes Historical Provider, MD   BP 153/74  Pulse 70  Temp(Src) 98.4 F (36.9 C) (Oral)  Resp 10  SpO2 93% Physical Exam  Nursing note and vitals reviewed. Constitutional: She is oriented to person, place, and time. She appears well-developed.  Frail, elderly  HENT:  Head: Normocephalic and atraumatic.  Eyes: Conjunctivae and EOM are normal. Pupils are equal, round, and reactive to light.  Neck: Normal range of motion and phonation normal. Neck supple.  Cardiovascular: Normal rate, regular rhythm and intact distal pulses.   Pulmonary/Chest: Effort normal and breath sounds normal. No respiratory distress. She has no wheezes. She exhibits no tenderness.  Good air movement, bilaterally. There is no increased work of breathing.  Tachypnea is present  Abdominal: Soft. She exhibits no distension. There is no tenderness. There is no guarding.  Musculoskeletal: Normal range of motion.  Neurological: She is alert and oriented to person, place, and time. She exhibits normal muscle tone.  Skin: Skin is warm and dry.  Psychiatric: Her behavior is normal. Judgment and thought content normal.  She appears anxious    ED Course  Procedures (including critical care time)  Medications  iohexol (OMNIPAQUE) 350 MG/ML injection 100 mL (100 mLs Intravenous Contrast Given 01/06/14 1552)    Patient Vitals for the past  24 hrs:  BP Temp Temp src Pulse Resp SpO2  01/06/14 1645 153/74 mmHg - - 70 - 93 %  01/06/14 1615 - 98.4 F (36.9 C) Oral - - -  01/06/14 1615 135/118 mmHg - - 70 10 98 %  01/06/14 1530 151/65 mmHg - - 67 14 99 %  01/06/14 1515 142/123 mmHg - - 68 23 100 %  01/06/14 1309 130/66 mmHg 98.2 F (36.8 C) - 77 24 100 %    At Discharge- Reevaluation with update and discussion. After initial assessment and treatment, an updated evaluation reveals no change in status. [name]   Labs Review Labs Reviewed  BASIC METABOLIC PANEL - Abnormal; Notable for the following:    Glucose, Bld 109 (*)    GFR calc non Af Amer 72 (*)    GFR calc Af Amer 84 (*)    Anion gap 20 (*)    All other components within normal limits  PROTIME-INR - Abnormal; Notable for the following:    Prothrombin Time 24.7 (*)    INR 2.23 (*)    All other components within normal limits  URINE CULTURE  CBC WITH DIFFERENTIAL  TROPONIN I  URINALYSIS, ROUTINE W REFLEX MICROSCOPIC    Imaging Review Ct Angio Chest Pe W/cm &/or Wo Cm  01/06/2014   CLINICAL DATA:  Increasing shortness of breath and fatigue, on Coumadin. Reported supra therapeutic and recent vitamin K administration. History of pulmonary embolism and clotting disorder.  EXAM: CT ANGIOGRAPHY CHEST WITH CONTRAST  TECHNIQUE: Multidetector CT imaging of the chest was performed using the standard protocol during bolus administration of intravenous contrast. Multiplanar CT image reconstructions and MIPs were obtained to evaluate the vascular anatomy.  CONTRAST:  186mL OMNIPAQUE IOHEXOL 350 MG/ML SOLN  COMPARISON:  Chest radiograph January 04, 2014; CT of the chest Nov 26, 2010  FINDINGS: Mild respiratory motion degraded examination. Adequate contrast opacification of the pulmonary artery's. Main pulmonary artery is not enlarged. No pulmonary arterial filling defects to the level of the subsegmental branches. Recanalization/resolution of emboli seen on prior CT.  The heart  is mildly enlarged; the pericardium is unremarkable, no right heart strain. Thoracic aorta is normal course and caliber, mild calcific atherosclerosis at the aortic arch. No lymphadenopathy by CT size criteria. Tracheobronchial tree is patent, no pneumothorax. 3 mm right upper lobe sub solid pulmonary nodule, unchanged. No pleural effusions or focal consolidations.  5 mm right upper pole renal calculus without hydronephrosis in the included view, unchanged. 3.6 cm hypodensity in right lobe of the liver most consistent hemangioma (better characterized on prior CT due to bolus timing), as seen on prior examination. Visualized soft tissues and included osseous structures appear normal.  Review of the MIP images confirms the above findings.  IMPRESSION: No pulmonary embolism nor acute cardiopulmonary process.  Stable mild cardiomegaly.   Electronically Signed   By: Elon Alas   On: 01/06/2014 16:22  EKG Interpretation None      MDM   Final diagnoses:  Dyspnea    Non-specific SOB with anxiety. Doubt PE, PNE, ACS, SBI or metabolic instability.   Nursing Notes Reviewed/ Care Coordinated Applicable Imaging Reviewed Interpretation of Laboratory Data incorporated into ED treatment  The patient appears reasonably screened and/or stabilized for discharge and I doubt any other medical condition or other [facility] requiring further screening, evaluation, or treatment in the ED at this time prior to discharge.  Plan: Home Medications- usual; Home Treatments- rest; return here if the recommended treatment, does not improve the symptoms; Recommended follow up- Pulmonary f/u 1-2 weeks, PCP prn    Richarda Blade, MD 01/06/14 2028

## 2014-01-06 NOTE — ED Notes (Signed)
Spouse feels like it has been about a week since she started becoming SOB. Pt states she missed her May appt for blood work accidentally but when she went back INR was 6.7, given a vitamin K tab and told not to take her coumadin for the two nights, went back in for recheck after that and her INR was 1.7. And now it is back within normal at 2.2. Pt has EKG, Chest xray, u/a and blood work completed while in the office on Tuesday. Pt reports feeling "so tired" and SOB.

## 2014-01-06 NOTE — ED Notes (Signed)
Transported to CT 

## 2014-01-11 ENCOUNTER — Ambulatory Visit (INDEPENDENT_AMBULATORY_CARE_PROVIDER_SITE_OTHER): Payer: Medicare Other | Admitting: Pulmonary Disease

## 2014-01-11 ENCOUNTER — Encounter: Payer: Self-pay | Admitting: Pulmonary Disease

## 2014-01-11 VITALS — BP 124/62 | HR 81 | Temp 98.2°F | Ht 62.0 in | Wt 113.4 lb

## 2014-01-11 DIAGNOSIS — R0609 Other forms of dyspnea: Secondary | ICD-10-CM

## 2014-01-11 DIAGNOSIS — R0989 Other specified symptoms and signs involving the circulatory and respiratory systems: Secondary | ICD-10-CM | POA: Diagnosis not present

## 2014-01-11 DIAGNOSIS — R0600 Dyspnea, unspecified: Secondary | ICD-10-CM | POA: Insufficient documentation

## 2014-01-11 NOTE — Patient Instructions (Signed)
Echocardiogram No evidence of oxygen levels dropping I will discuss with dr Laurann Montana

## 2014-01-11 NOTE — Assessment & Plan Note (Addendum)
I do not see any pulmonary cause for her dyspnea. She does not have bronchospasm, would not suspect COPD in this never smoker, is no evidence of interstitial lung disease, pulmonary embolism has been ruled out by CT angiogram.  Have suggest we proceed with Echocardiogram for cardiac valvular function and also to assess for residual pulmonary hypertension from emboli. We can also assess for atrial septal defect. Her negative testing so far is reassuring. I doubt that cardiac pulmonary stress testing would add anything to what we already know so far.  May suggest evaluation for systemic illnesses such as hyperthyroidism, if not already done. Diagnosis of exclusion would be generalized anxiety disorder

## 2014-01-11 NOTE — Progress Notes (Signed)
Subjective:    Patient ID: April Hernandez, female    DOB: 1940/12/30, 73 y.o.   MRN: [id number]  HPI  73 year old never smoker presents for evaluation of dyspnea. She was diagnosed with bilateral pulmonary embolism in 11/2010 ,she was noted to be a heterozygote for factor V  Leiden mutation. Liver  lesion was noted and this was felt to be hemangioma on MRI. She was referred to the thrombophilia clinic at [facility], and I believe lifelong anticoagulation was advised. Her INR was 6.8 in may 2015 she was given vitamin K. This dropped the INR to less than 2, she developed chills about 3 weeks ago followed by increasing fatigue and loss of appetite. She developed increasing dyspnea at rest as well as exertion for the last week and finally went to the emergency room on 01/06/14, CT angiogram did not show any pulmonary embolus, 3 mm right upper lobe nodule was unchanged from 2012. The liver density was unchanged, and a 5 mm right renal calculus was noted without hydronephrosis. She denies wheezing or sputum production. She denies chest pain or leg swelling. Dyspnea is persistent throughout the day, she reports that she can control this if she focuses on her breathing, she reports that dyspnea is improved during sleep but worse on lying on her side. On ambulating 3 laps in the office, her O2 sats stayed at 99% and heart rate increased from 80-94, no rhonchi were auscultated. EKG showed right bundle branch block, which was noted in 2012. She denies significant anxiety or recent stressors. Labs unremarkable, ER records were reviewed I discussed with Dr. Laurann Montana  Past Medical History  Diagnosis Date  . Pulmonary embolism 11/2010  . Factor 5 Leiden mutation, heterozygous   . Hypertension     Past Surgical History  Procedure Laterality Date  . Ankle fracture surgery    . Kideny stones removed      No Known Allergies  History   Social History  . Marital Status: Married    Spouse Name: N/A    Number of  Children: 3  . Years of Education: N/A   Occupational History  . house wife    Social History Main Topics  . Smoking status: Never Smoker   . Smokeless tobacco: Not on file  . Alcohol Use: Yes     Comment: occas  . Drug Use: No  . Sexual Activity: Not on file   Other Topics Concern  . Not on file   Social History Narrative  . No narrative on file    Family History  Problem Relation Age of Onset  . Heart murmur Mother   . Heart failure Mother   . Lung cancer Maternal Aunt   . Clotting disorder Father   . Breast cancer Maternal Aunt         Review of Systems  Constitutional: Positive for appetite change and unexpected weight change. Negative for fever.  HENT: Negative for congestion, dental problem, ear pain, nosebleeds, postnasal drip, rhinorrhea, sinus pressure, sneezing, sore throat and trouble swallowing.   Eyes: Negative for redness and itching.  Respiratory: Positive for cough and shortness of breath. Negative for chest tightness and wheezing.   Cardiovascular: Negative for palpitations and leg swelling.  Gastrointestinal: Negative for nausea and vomiting.  Genitourinary: Negative for dysuria.  Musculoskeletal: Negative for joint swelling.  Skin: Negative for rash.  Neurological: Negative for headaches.  Hematological: Does not bruise/bleed easily.  Psychiatric/Behavioral: Negative for dysphoric mood. The patient is not nervous/anxious.  Objective:   Physical Exam  Gen. Pleasant, well-nourished, in mild distress, anxious affect ENT - no lesions, no post nasal drip Neck: No JVD, no thyromegaly, no carotid bruits Lungs: no use of accessory muscles, no dullness to percussion, clear without rales or rhonchi  Cardiovascular: Rhythm regular, heart sounds  normal, no murmurs or gallops, no peripheral edema Abdomen: soft and non-tender, no hepatosplenomegaly, BS normal. Musculoskeletal: No deformities, no cyanosis or clubbing Neuro:  alert, non  focal       Assessment & Plan:

## 2014-01-17 ENCOUNTER — Ambulatory Visit (HOSPITAL_COMMUNITY)
Admission: RE | Admit: 2014-01-17 | Discharge: 2014-01-17 | Disposition: A | Discharge status: Home / Self Care | Payer: Medicare Other | Source: Ambulatory Visit | Attending: Pulmonary Disease | Admitting: Pulmonary Disease

## 2014-01-17 DIAGNOSIS — R0602 Shortness of breath: Secondary | ICD-10-CM | POA: Diagnosis not present

## 2014-01-17 DIAGNOSIS — R0609 Other forms of dyspnea: Secondary | ICD-10-CM | POA: Insufficient documentation

## 2014-01-17 DIAGNOSIS — R0989 Other specified symptoms and signs involving the circulatory and respiratory systems: Secondary | ICD-10-CM | POA: Diagnosis not present

## 2014-01-17 DIAGNOSIS — R0600 Dyspnea, unspecified: Secondary | ICD-10-CM

## 2014-01-17 NOTE — Progress Notes (Addendum)
2D Echocardiogram Complete.  01/17/2014   Deliah Boston, RDCS   Preliminary Technician Findings: Mrs. Lightle is very short of breath at the time of testing.  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  She will be following up with her Pulmonologist for treatment.

## 2014-01-18 ENCOUNTER — Telehealth: Payer: Self-pay | Admitting: Pulmonary Disease

## 2014-01-18 ENCOUNTER — Telehealth: Payer: Self-pay | Admitting: *Deleted

## 2014-01-18 ENCOUNTER — Other Ambulatory Visit: Payer: Self-pay | Admitting: Pulmonary Disease

## 2014-01-18 DIAGNOSIS — R0600 Dyspnea, unspecified: Secondary | ICD-10-CM

## 2014-01-18 NOTE — Telephone Encounter (Signed)
Per 2D Echo results by RA: Result Notes     Notes Recorded by Inge Rise, CMA on 01/18/2014 at 11:34 AM I spoke with patient about results and she verbalized understanding and had no questions She will discuss this with spouse prior to [facility] for cards referral. ------  Notes Recorded by Rigoberto Noel, MD on 01/18/2014 at 10:56 AM No pulmonary hypertension  Mild mitral leaflet prolapse - doubt this is cause but can refer to cards since no other cause found I did speak to dr Laurann Montana    Pt's spouse called again regarding pending cardiology appt.  Spouse is requesting appt for this upcoming Friday 7.17.15 as he is currently out of town and will return the evening on 7.16.15.  He will also be leaving again for [place] on 7.21.15 so unable to come with pt for appt if it were scheduled for next week.  Advised spouse referral would be placed with his date requests in mind and he will be called for appt date/time.  Spouse okay with this - please call back at his cell @ [phone].  The cardiology referral has been placed.  Will sign off.

## 2014-01-18 NOTE — Telephone Encounter (Signed)
error 

## 2014-01-21 ENCOUNTER — Other Ambulatory Visit: Payer: Self-pay | Admitting: Internal Medicine

## 2014-01-21 DIAGNOSIS — R0609 Other forms of dyspnea: Secondary | ICD-10-CM | POA: Diagnosis not present

## 2014-01-21 DIAGNOSIS — R5383 Other fatigue: Secondary | ICD-10-CM | POA: Diagnosis not present

## 2014-01-21 DIAGNOSIS — Z7901 Long term (current) use of anticoagulants: Secondary | ICD-10-CM | POA: Diagnosis not present

## 2014-01-21 DIAGNOSIS — Z5181 Encounter for therapeutic drug level monitoring: Secondary | ICD-10-CM | POA: Diagnosis not present

## 2014-01-21 DIAGNOSIS — R5381 Other malaise: Secondary | ICD-10-CM | POA: Diagnosis not present

## 2014-01-21 DIAGNOSIS — N63 Unspecified lump in unspecified breast: Secondary | ICD-10-CM

## 2014-01-27 ENCOUNTER — Encounter: Payer: Self-pay | Admitting: *Deleted

## 2014-02-15 ENCOUNTER — Encounter: Payer: Self-pay | Admitting: Cardiology

## 2014-02-15 ENCOUNTER — Ambulatory Visit (INDEPENDENT_AMBULATORY_CARE_PROVIDER_SITE_OTHER): Payer: Medicare Other | Admitting: Cardiology

## 2014-02-15 VITALS — BP 144/90 | HR 89 | Ht 62.5 in | Wt 114.0 lb

## 2014-02-15 DIAGNOSIS — R0600 Dyspnea, unspecified: Secondary | ICD-10-CM

## 2014-02-15 DIAGNOSIS — R9431 Abnormal electrocardiogram [ECG] [EKG]: Secondary | ICD-10-CM | POA: Diagnosis not present

## 2014-02-15 DIAGNOSIS — R0989 Other specified symptoms and signs involving the circulatory and respiratory systems: Secondary | ICD-10-CM

## 2014-02-15 DIAGNOSIS — R0609 Other forms of dyspnea: Secondary | ICD-10-CM

## 2014-02-15 NOTE — Patient Instructions (Signed)
Your physician recommends that you schedule a follow-up appointment in: as needed  

## 2014-02-15 NOTE — Progress Notes (Signed)
HPI The patient presents for evaluation of possible mitral valve prolapse. She has a history of pulmonary emboli with a factor V Leiden deficiency. She has been on anticoagulation with warfarin and more recently with Xarelto.  She had an episode of chills and not feeling well in June. Following that she had extreme fatigue. She developed shortness of breath with activities. The cause of this was not clear however. I did review some blood work done by Irven Shelling, MD and there was no abnormality and a CBC or basic metabolic profile.  She did present to the emergency room. Review studies following this included a CT which demonstrated no evidence of pulmonary emboli and a very small stable nodule. She did have an echocardiogram and a review these results. There was questionable mild mitral valve prolapse but no evidence of regurgitation. She did see a pulmonologist who reported that there was no evidence of pulmonary hypertension. She has since had resolution of her symptoms. She's now back to walking the dog. With this she denies any chest pressure, neck or arm discomfort. She's not having any palpitations or syncope. She's having no PND or orthopnea. She's had no cough fevers or chills.   No Known Allergies  Current Outpatient Prescriptions  Medication Sig Dispense Refill  . acetaminophen (TYLENOL) 325 MG tablet Take 650 mg by mouth every 6 (six) hours as needed.        Marland Kitchen amLODipine (NORVASC) 5 MG tablet Take 5 mg by mouth daily.      . Cholecalciferol (VITAMIN D) 1000 UNITS capsule Take 1,000 Units by mouth daily.        . Glucosamine-Chondroit-Vit C-Mn (GLUCOSAMINE CHONDR 1500 COMPLX PO) Take 2 tablets by mouth daily.        . IRON CR PO Take 1 tablet by mouth daily.        . Multiple Vitamins-Minerals (CENTRUM SILVER PO) Take 1 tablet by mouth daily.        Marland Kitchen OVER THE COUNTER MEDICATION Take 1 tablet by mouth 2 (two) times daily. Omega XL      . rivaroxaban (XARELTO) 20 MG TABS tablet  Take 20 mg by mouth daily with supper.       No current facility-administered medications for this visit.    Past Medical History  Diagnosis Date  . Pulmonary embolism 11/2010  . Factor 5 Leiden mutation, heterozygous   . Hypertension     Past Surgical History  Procedure Laterality Date  . Ankle fracture surgery    . Kideny stones removed      Family History  Problem Relation Age of Onset  . Heart murmur Mother   . Heart failure Mother   . Lung cancer Maternal Aunt   . Clotting disorder Father   . Breast cancer Maternal Aunt     History   Social History  . Marital Status: Married    Spouse Name: N/A    Number of Children: 3  . Years of Education: N/A   Occupational History  . house wife    Social History Main Topics  . Smoking status: Never Smoker   . Smokeless tobacco: Not on file  . Alcohol Use: Yes     Comment: occas  . Drug Use: No  . Sexual Activity: Not Currently   Other Topics Concern  . Not on file   Social History Narrative  . No narrative on file    ROS:  Positive for intermittent leg cramps at night. Otherwise as stated  in the HPI and negative for all other systems.  PHYSICAL EXAM BP 144/90  Pulse 89  Ht 5' 2.5" (1.588 m)  Wt 114 lb (51.71 kg)  BMI 20.51 kg/m2 GENERAL:  Well appearing HEENT:  Pupils equal round and reactive, fundi not visualized, oral mucosa unremarkable NECK:  No jugular venous distention, waveform within normal limits, carotid upstroke brisk and symmetric, no bruits, no thyromegaly LYMPHATICS:  No cervical, inguinal adenopathy LUNGS:  Clear to auscultation bilaterally BACK:  No CVA tenderness CHEST:  Unremarkable HEART:  PMI not displaced or sustained,S1 and S2 within normal limits, no S3, no S4, no clicks, no rubs, no murmurs ABD:  Flat, positive bowel sounds normal in frequency in pitch, no bruits, no rebound, no guarding, no midline pulsatile mass, no hepatomegaly, no splenomegaly EXT:  2 plus pulses throughout, no  edema, no cyanosis no clubbing SKIN:  No rashes no nodules NEURO:  Cranial nerves II through XII grossly intact, motor grossly intact throughout PSYCH:  Cognitively intact, oriented to person place and time  EKG:    Sinus rhythm, right bundle branch block, no acute ST-T wave changes.  01/08/14  ASSESSMENT AND PLAN  MVP:  This was very mild on echo. No further workup is suggested. This would not be causing the etiology of her complaints.  DYSPNEA:  Given the fact her symptoms have resolved and fairly involved workup was negative we might not know the etiology of this event.  I went over this in detail with her and her son.  RBBB:  This is chronic and unchanged from 2012. No further evaluation is warranted.

## 2014-04-05 DIAGNOSIS — Z01419 Encounter for gynecological examination (general) (routine) without abnormal findings: Secondary | ICD-10-CM | POA: Diagnosis not present

## 2014-04-05 DIAGNOSIS — Z124 Encounter for screening for malignant neoplasm of cervix: Secondary | ICD-10-CM | POA: Diagnosis not present

## 2014-04-14 DIAGNOSIS — M1712 Unilateral primary osteoarthritis, left knee: Secondary | ICD-10-CM | POA: Diagnosis not present

## 2014-05-03 DIAGNOSIS — Z23 Encounter for immunization: Secondary | ICD-10-CM | POA: Diagnosis not present

## 2014-05-06 ENCOUNTER — Other Ambulatory Visit (HOSPITAL_COMMUNITY): Payer: Self-pay | Admitting: Internal Medicine

## 2014-05-06 DIAGNOSIS — Z1231 Encounter for screening mammogram for malignant neoplasm of breast: Secondary | ICD-10-CM

## 2014-06-06 ENCOUNTER — Ambulatory Visit (HOSPITAL_COMMUNITY)
Admission: RE | Admit: 2014-06-06 | Discharge: 2014-06-06 | Disposition: A | Discharge status: Home / Self Care | Payer: Medicare Other | Source: Ambulatory Visit | Attending: Internal Medicine | Admitting: Internal Medicine

## 2014-06-06 DIAGNOSIS — Z1231 Encounter for screening mammogram for malignant neoplasm of breast: Secondary | ICD-10-CM | POA: Insufficient documentation

## 2014-07-15 DIAGNOSIS — H2513 Age-related nuclear cataract, bilateral: Secondary | ICD-10-CM | POA: Diagnosis not present

## 2014-07-15 DIAGNOSIS — H5203 Hypermetropia, bilateral: Secondary | ICD-10-CM | POA: Diagnosis not present

## 2014-07-15 DIAGNOSIS — H524 Presbyopia: Secondary | ICD-10-CM | POA: Diagnosis not present

## 2014-07-15 DIAGNOSIS — H04123 Dry eye syndrome of bilateral lacrimal glands: Secondary | ICD-10-CM | POA: Diagnosis not present

## 2014-07-15 DIAGNOSIS — H52223 Regular astigmatism, bilateral: Secondary | ICD-10-CM | POA: Diagnosis not present

## 2014-07-21 DIAGNOSIS — I10 Essential (primary) hypertension: Secondary | ICD-10-CM | POA: Diagnosis not present

## 2014-07-21 DIAGNOSIS — R351 Nocturia: Secondary | ICD-10-CM | POA: Diagnosis not present

## 2014-07-21 DIAGNOSIS — R21 Rash and other nonspecific skin eruption: Secondary | ICD-10-CM | POA: Diagnosis not present

## 2014-08-24 DIAGNOSIS — L309 Dermatitis, unspecified: Secondary | ICD-10-CM | POA: Diagnosis not present

## 2014-12-28 ENCOUNTER — Other Ambulatory Visit: Payer: Self-pay | Admitting: Internal Medicine

## 2014-12-28 ENCOUNTER — Other Ambulatory Visit: Payer: Self-pay | Admitting: Adult Health

## 2014-12-28 ENCOUNTER — Ambulatory Visit
Admission: RE | Admit: 2014-12-28 | Discharge: 2014-12-28 | Disposition: A | Discharge status: Home / Self Care | Payer: Medicare Other | Source: Ambulatory Visit | Attending: Internal Medicine | Admitting: Internal Medicine

## 2014-12-28 DIAGNOSIS — Z23 Encounter for immunization: Secondary | ICD-10-CM | POA: Diagnosis not present

## 2014-12-28 DIAGNOSIS — Z1389 Encounter for screening for other disorder: Secondary | ICD-10-CM | POA: Diagnosis not present

## 2014-12-28 DIAGNOSIS — M546 Pain in thoracic spine: Secondary | ICD-10-CM

## 2014-12-28 DIAGNOSIS — R5383 Other fatigue: Secondary | ICD-10-CM | POA: Diagnosis not present

## 2014-12-28 DIAGNOSIS — Z0000 Encounter for general adult medical examination without abnormal findings: Secondary | ICD-10-CM | POA: Diagnosis not present

## 2014-12-28 DIAGNOSIS — M810 Age-related osteoporosis without current pathological fracture: Secondary | ICD-10-CM | POA: Diagnosis not present

## 2014-12-28 DIAGNOSIS — I10 Essential (primary) hypertension: Secondary | ICD-10-CM | POA: Diagnosis not present

## 2015-01-24 DIAGNOSIS — M810 Age-related osteoporosis without current pathological fracture: Secondary | ICD-10-CM | POA: Diagnosis not present

## 2015-01-25 DIAGNOSIS — R5383 Other fatigue: Secondary | ICD-10-CM | POA: Diagnosis not present

## 2015-01-25 DIAGNOSIS — I10 Essential (primary) hypertension: Secondary | ICD-10-CM | POA: Diagnosis not present

## 2015-01-30 DIAGNOSIS — Z7901 Long term (current) use of anticoagulants: Secondary | ICD-10-CM | POA: Diagnosis not present

## 2015-01-30 DIAGNOSIS — Z1211 Encounter for screening for malignant neoplasm of colon: Secondary | ICD-10-CM | POA: Diagnosis not present

## 2015-02-09 DIAGNOSIS — G479 Sleep disorder, unspecified: Secondary | ICD-10-CM | POA: Diagnosis not present

## 2015-02-10 DIAGNOSIS — R400 Somnolence: Secondary | ICD-10-CM | POA: Diagnosis not present

## 2015-03-17 DIAGNOSIS — Z1211 Encounter for screening for malignant neoplasm of colon: Secondary | ICD-10-CM | POA: Diagnosis not present

## 2015-03-17 DIAGNOSIS — K648 Other hemorrhoids: Secondary | ICD-10-CM | POA: Diagnosis not present

## 2015-03-30 DIAGNOSIS — H5203 Hypermetropia, bilateral: Secondary | ICD-10-CM | POA: Diagnosis not present

## 2015-03-30 DIAGNOSIS — H2513 Age-related nuclear cataract, bilateral: Secondary | ICD-10-CM | POA: Diagnosis not present

## 2015-03-30 DIAGNOSIS — H524 Presbyopia: Secondary | ICD-10-CM | POA: Diagnosis not present

## 2015-03-30 DIAGNOSIS — H52223 Regular astigmatism, bilateral: Secondary | ICD-10-CM | POA: Diagnosis not present

## 2015-04-24 DIAGNOSIS — Z124 Encounter for screening for malignant neoplasm of cervix: Secondary | ICD-10-CM | POA: Diagnosis not present

## 2015-04-24 DIAGNOSIS — Z01419 Encounter for gynecological examination (general) (routine) without abnormal findings: Secondary | ICD-10-CM | POA: Diagnosis not present

## 2015-05-02 DIAGNOSIS — M791 Myalgia: Secondary | ICD-10-CM | POA: Diagnosis not present

## 2015-05-02 DIAGNOSIS — Z23 Encounter for immunization: Secondary | ICD-10-CM | POA: Diagnosis not present

## 2015-05-09 ENCOUNTER — Other Ambulatory Visit: Payer: Self-pay

## 2015-05-09 DIAGNOSIS — Z1231 Encounter for screening mammogram for malignant neoplasm of breast: Secondary | ICD-10-CM

## 2015-06-12 ENCOUNTER — Ambulatory Visit
Admission: RE | Admit: 2015-06-12 | Discharge: 2015-06-12 | Disposition: A | Discharge status: Home / Self Care | Payer: Medicare Other | Source: Ambulatory Visit

## 2015-06-12 DIAGNOSIS — Z1231 Encounter for screening mammogram for malignant neoplasm of breast: Secondary | ICD-10-CM

## 2015-06-19 DIAGNOSIS — I10 Essential (primary) hypertension: Secondary | ICD-10-CM | POA: Diagnosis not present

## 2015-06-19 DIAGNOSIS — M79605 Pain in left leg: Secondary | ICD-10-CM | POA: Diagnosis not present

## 2015-06-19 DIAGNOSIS — G4700 Insomnia, unspecified: Secondary | ICD-10-CM | POA: Diagnosis not present

## 2015-08-04 ENCOUNTER — Other Ambulatory Visit: Payer: Self-pay | Admitting: Internal Medicine

## 2015-08-04 ENCOUNTER — Ambulatory Visit
Admission: RE | Admit: 2015-08-04 | Discharge: 2015-08-04 | Disposition: A | Discharge status: Home / Self Care | Payer: Medicare Other | Source: Ambulatory Visit | Attending: Internal Medicine | Admitting: Internal Medicine

## 2015-08-04 DIAGNOSIS — M79672 Pain in left foot: Secondary | ICD-10-CM

## 2015-08-04 DIAGNOSIS — M79662 Pain in left lower leg: Secondary | ICD-10-CM | POA: Diagnosis not present

## 2015-08-04 DIAGNOSIS — M19072 Primary osteoarthritis, left ankle and foot: Secondary | ICD-10-CM | POA: Diagnosis not present

## 2015-08-08 DIAGNOSIS — M79671 Pain in right foot: Secondary | ICD-10-CM | POA: Diagnosis not present

## 2015-08-08 DIAGNOSIS — M79672 Pain in left foot: Secondary | ICD-10-CM | POA: Diagnosis not present

## 2015-08-23 DIAGNOSIS — L309 Dermatitis, unspecified: Secondary | ICD-10-CM | POA: Diagnosis not present

## 2015-08-23 DIAGNOSIS — C4441 Basal cell carcinoma of skin of scalp and neck: Secondary | ICD-10-CM | POA: Diagnosis not present

## 2015-08-23 DIAGNOSIS — L570 Actinic keratosis: Secondary | ICD-10-CM | POA: Diagnosis not present

## 2015-08-23 DIAGNOSIS — D2239 Melanocytic nevi of other parts of face: Secondary | ICD-10-CM | POA: Diagnosis not present

## 2015-08-23 DIAGNOSIS — L821 Other seborrheic keratosis: Secondary | ICD-10-CM | POA: Diagnosis not present

## 2015-09-28 DIAGNOSIS — R5383 Other fatigue: Secondary | ICD-10-CM | POA: Diagnosis not present

## 2015-09-28 DIAGNOSIS — M79662 Pain in left lower leg: Secondary | ICD-10-CM | POA: Diagnosis not present

## 2015-09-28 IMAGING — CR DG CHEST 2V
2 series · 2 of 2 positions shown · non-contrast
Comparison: 05/26/2012

ADDENDUM:
Additional lateral view of the chest submitted. No acute infiltrate
or pulmonary edema. No destructive bony lesions are noted.
CLINICAL DATA: Shortness of Breath

EXAM:
CHEST  2 VIEW

[w chest pa]
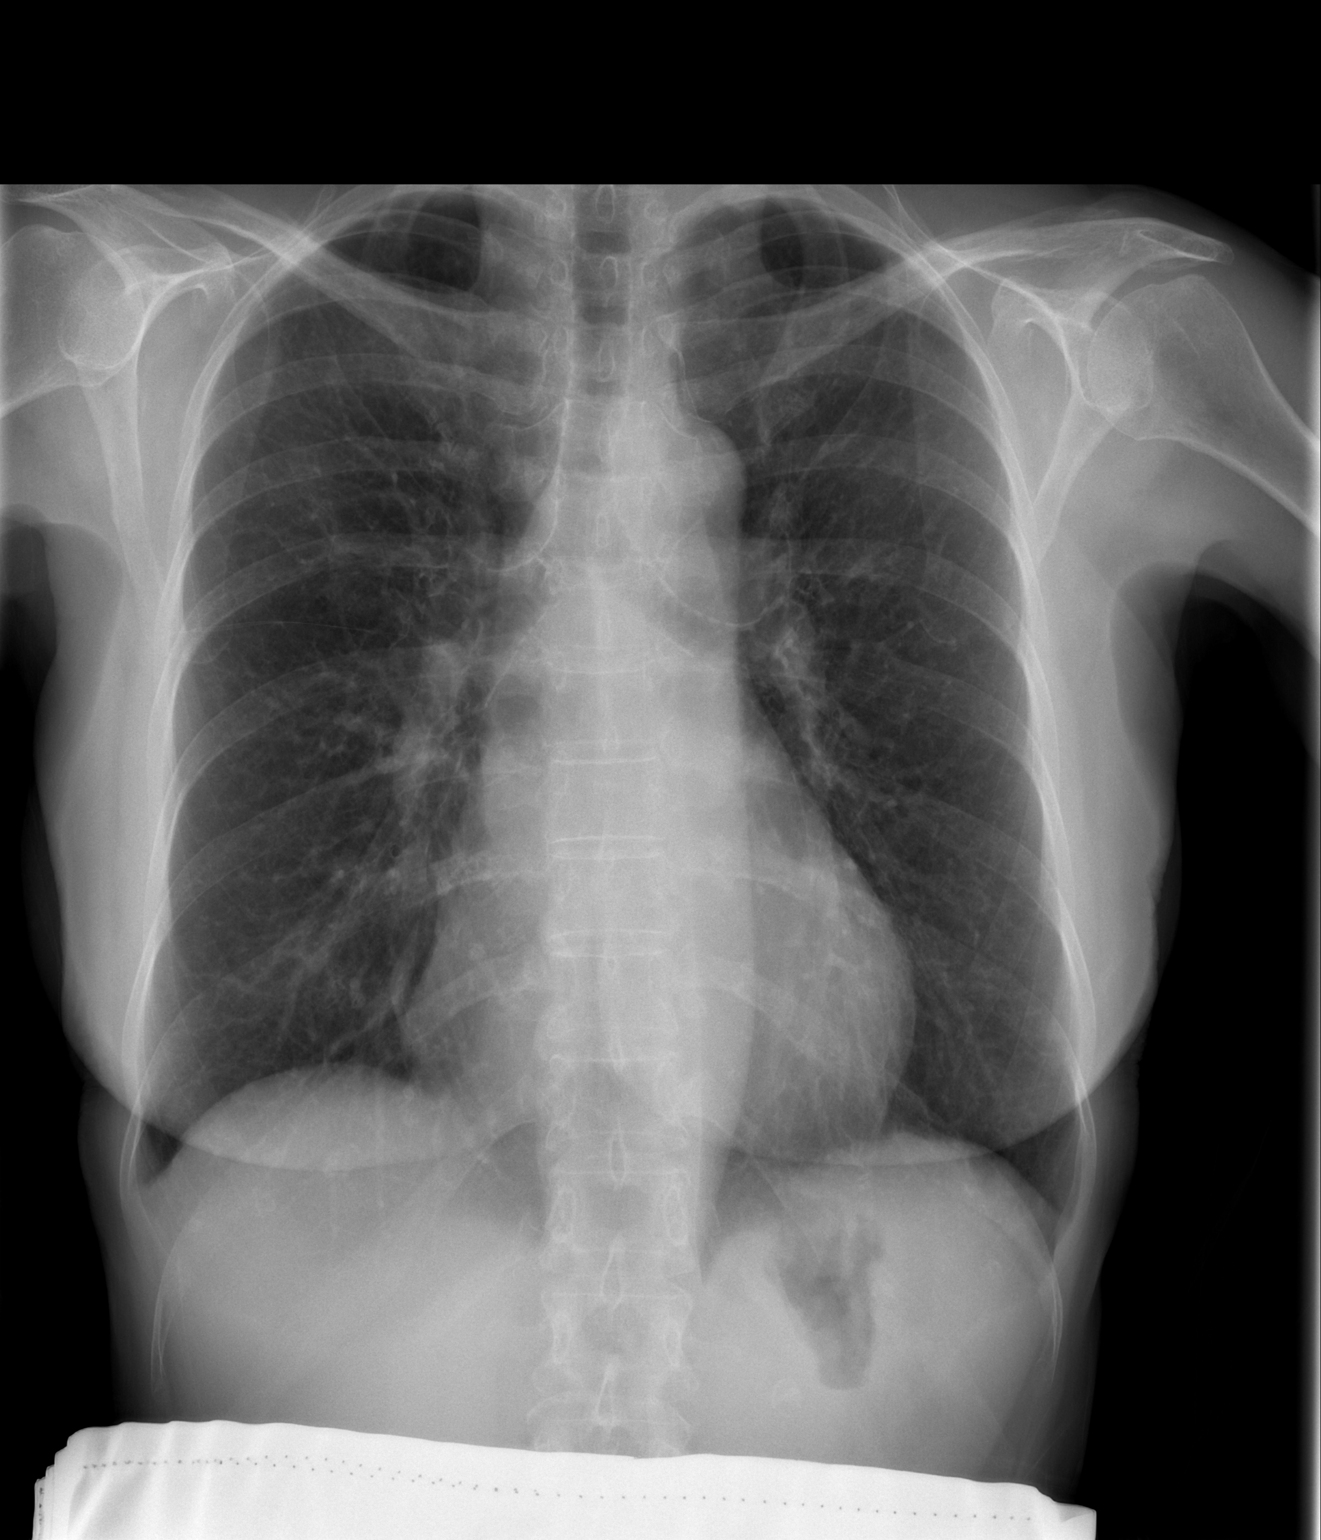

[w chest lat]
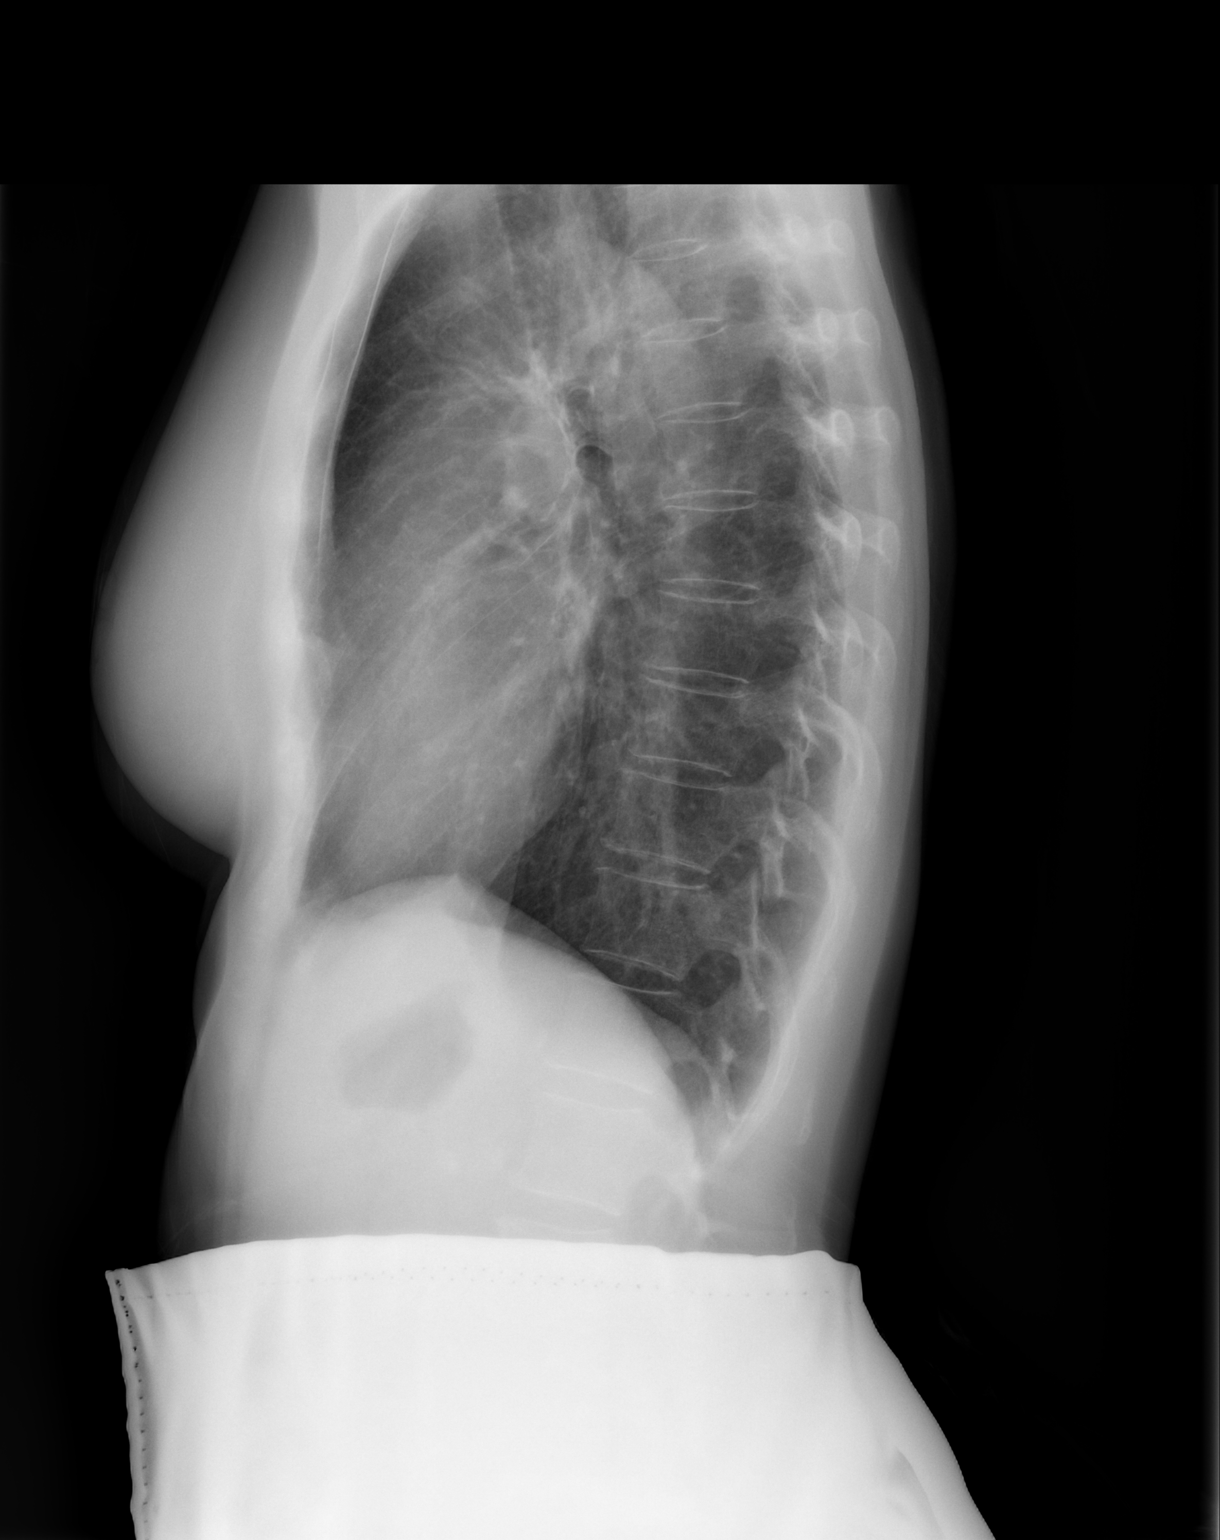

[2 of 2 positions shown; findings below may reference images not displayed]

FINDINGS: Cardiomediastinal silhouette is unremarkable. Mild hyperinflation.
No acute infiltrate or pulmonary edema.
IMPRESSION: No active disease.  Mild hyperinflation.

## 2015-09-29 ENCOUNTER — Other Ambulatory Visit: Payer: Self-pay | Admitting: Internal Medicine

## 2015-09-29 DIAGNOSIS — M79605 Pain in left leg: Secondary | ICD-10-CM

## 2015-10-07 ENCOUNTER — Ambulatory Visit
Admission: RE | Admit: 2015-10-07 | Discharge: 2015-10-07 | Disposition: A | Discharge status: Home / Self Care | Payer: Medicare Other | Source: Ambulatory Visit | Attending: Internal Medicine | Admitting: Internal Medicine

## 2015-10-07 DIAGNOSIS — M79605 Pain in left leg: Secondary | ICD-10-CM

## 2015-10-07 DIAGNOSIS — M5126 Other intervertebral disc displacement, lumbar region: Secondary | ICD-10-CM | POA: Diagnosis not present

## 2015-10-12 DIAGNOSIS — M1712 Unilateral primary osteoarthritis, left knee: Secondary | ICD-10-CM | POA: Diagnosis not present

## 2015-10-30 DIAGNOSIS — M546 Pain in thoracic spine: Secondary | ICD-10-CM | POA: Diagnosis not present

## 2015-12-13 DIAGNOSIS — K5900 Constipation, unspecified: Secondary | ICD-10-CM | POA: Diagnosis not present

## 2016-01-01 DIAGNOSIS — Z1389 Encounter for screening for other disorder: Secondary | ICD-10-CM | POA: Diagnosis not present

## 2016-01-01 DIAGNOSIS — Z0000 Encounter for general adult medical examination without abnormal findings: Secondary | ICD-10-CM | POA: Diagnosis not present

## 2016-01-01 DIAGNOSIS — I10 Essential (primary) hypertension: Secondary | ICD-10-CM | POA: Diagnosis not present

## 2016-01-01 DIAGNOSIS — Z86711 Personal history of pulmonary embolism: Secondary | ICD-10-CM | POA: Diagnosis not present

## 2016-01-01 DIAGNOSIS — D6851 Activated protein C resistance: Secondary | ICD-10-CM | POA: Diagnosis not present

## 2016-01-16 DIAGNOSIS — M25562 Pain in left knee: Secondary | ICD-10-CM | POA: Diagnosis not present

## 2016-04-04 DIAGNOSIS — H524 Presbyopia: Secondary | ICD-10-CM | POA: Diagnosis not present

## 2016-04-04 DIAGNOSIS — H52223 Regular astigmatism, bilateral: Secondary | ICD-10-CM | POA: Diagnosis not present

## 2016-04-04 DIAGNOSIS — H5203 Hypermetropia, bilateral: Secondary | ICD-10-CM | POA: Diagnosis not present

## 2016-04-04 DIAGNOSIS — H2513 Age-related nuclear cataract, bilateral: Secondary | ICD-10-CM | POA: Diagnosis not present

## 2016-04-04 DIAGNOSIS — H04123 Dry eye syndrome of bilateral lacrimal glands: Secondary | ICD-10-CM | POA: Diagnosis not present

## 2016-04-23 DIAGNOSIS — R233 Spontaneous ecchymoses: Secondary | ICD-10-CM | POA: Diagnosis not present

## 2016-04-23 DIAGNOSIS — R8290 Unspecified abnormal findings in urine: Secondary | ICD-10-CM | POA: Diagnosis not present

## 2016-04-24 DIAGNOSIS — M310 Hypersensitivity angiitis: Secondary | ICD-10-CM | POA: Diagnosis not present

## 2016-04-24 DIAGNOSIS — Z85828 Personal history of other malignant neoplasm of skin: Secondary | ICD-10-CM | POA: Diagnosis not present

## 2016-04-29 DIAGNOSIS — Z124 Encounter for screening for malignant neoplasm of cervix: Secondary | ICD-10-CM | POA: Diagnosis not present

## 2016-04-29 DIAGNOSIS — Z01419 Encounter for gynecological examination (general) (routine) without abnormal findings: Secondary | ICD-10-CM | POA: Diagnosis not present

## 2016-05-10 ENCOUNTER — Other Ambulatory Visit: Payer: Self-pay | Admitting: Internal Medicine

## 2016-05-10 DIAGNOSIS — Z1231 Encounter for screening mammogram for malignant neoplasm of breast: Secondary | ICD-10-CM

## 2016-06-12 ENCOUNTER — Ambulatory Visit
Admission: RE | Admit: 2016-06-12 | Discharge: 2016-06-12 | Disposition: A | Discharge status: Home / Self Care | Payer: Medicare Other | Source: Ambulatory Visit | Attending: Internal Medicine | Admitting: Internal Medicine

## 2016-06-12 DIAGNOSIS — Z1231 Encounter for screening mammogram for malignant neoplasm of breast: Secondary | ICD-10-CM

## 2016-06-19 DIAGNOSIS — B9789 Other viral agents as the cause of diseases classified elsewhere: Secondary | ICD-10-CM | POA: Diagnosis not present

## 2016-06-19 DIAGNOSIS — J069 Acute upper respiratory infection, unspecified: Secondary | ICD-10-CM | POA: Diagnosis not present

## 2016-06-28 DIAGNOSIS — I10 Essential (primary) hypertension: Secondary | ICD-10-CM | POA: Diagnosis not present

## 2016-06-28 DIAGNOSIS — Z7901 Long term (current) use of anticoagulants: Secondary | ICD-10-CM | POA: Diagnosis not present

## 2016-06-28 DIAGNOSIS — R5383 Other fatigue: Secondary | ICD-10-CM | POA: Diagnosis not present

## 2016-06-28 DIAGNOSIS — K529 Noninfective gastroenteritis and colitis, unspecified: Secondary | ICD-10-CM | POA: Diagnosis not present

## 2016-06-28 DIAGNOSIS — E559 Vitamin D deficiency, unspecified: Secondary | ICD-10-CM | POA: Diagnosis not present

## 2016-07-04 DIAGNOSIS — Z23 Encounter for immunization: Secondary | ICD-10-CM | POA: Diagnosis not present

## 2016-07-23 DIAGNOSIS — M79605 Pain in left leg: Secondary | ICD-10-CM | POA: Diagnosis not present

## 2016-07-23 DIAGNOSIS — I10 Essential (primary) hypertension: Secondary | ICD-10-CM | POA: Diagnosis not present

## 2016-07-30 DIAGNOSIS — M1712 Unilateral primary osteoarthritis, left knee: Secondary | ICD-10-CM | POA: Diagnosis not present

## 2016-08-19 DIAGNOSIS — M25562 Pain in left knee: Secondary | ICD-10-CM | POA: Diagnosis not present

## 2016-08-30 ENCOUNTER — Other Ambulatory Visit: Payer: Self-pay | Admitting: Orthopedic Surgery

## 2016-10-18 ENCOUNTER — Other Ambulatory Visit: Payer: Self-pay | Admitting: Orthopedic Surgery

## 2016-10-21 ENCOUNTER — Encounter (HOSPITAL_COMMUNITY)
Admission: RE | Admit: 2016-10-21 | Discharge: 2016-10-21 | Disposition: A | Discharge status: Home / Self Care | Payer: Medicare Other | Source: Ambulatory Visit | Attending: Orthopedic Surgery | Admitting: Orthopedic Surgery

## 2016-10-21 ENCOUNTER — Ambulatory Visit (HOSPITAL_COMMUNITY): Admission: RE | Admit: 2016-10-21 | Payer: Medicare Other | Source: Ambulatory Visit

## 2016-10-21 ENCOUNTER — Encounter (HOSPITAL_COMMUNITY): Payer: Self-pay

## 2016-10-21 DIAGNOSIS — Z01812 Encounter for preprocedural laboratory examination: Secondary | ICD-10-CM | POA: Diagnosis not present

## 2016-10-21 DIAGNOSIS — Z0183 Encounter for blood typing: Secondary | ICD-10-CM | POA: Insufficient documentation

## 2016-10-21 DIAGNOSIS — M1712 Unilateral primary osteoarthritis, left knee: Secondary | ICD-10-CM | POA: Insufficient documentation

## 2016-10-21 DIAGNOSIS — I4510 Unspecified right bundle-branch block: Secondary | ICD-10-CM | POA: Insufficient documentation

## 2016-10-21 DIAGNOSIS — I10 Essential (primary) hypertension: Secondary | ICD-10-CM | POA: Insufficient documentation

## 2016-10-21 DIAGNOSIS — Z01818 Encounter for other preprocedural examination: Secondary | ICD-10-CM | POA: Diagnosis not present

## 2016-10-21 HISTORY — DX: Unspecified osteoarthritis, unspecified site: M19.90

## 2016-10-21 LAB — URINALYSIS, ROUTINE W REFLEX MICROSCOPIC
Bilirubin Urine: NEGATIVE
GLUCOSE, UA: NEGATIVE mg/dL
Ketones, ur: NEGATIVE mg/dL
Nitrite: NEGATIVE
PROTEIN: NEGATIVE mg/dL
SQUAMOUS EPITHELIAL / LPF: NONE SEEN
Specific Gravity, Urine: 1.006 (ref 1.005–1.030)
pH: 6 (ref 5.0–8.0)

## 2016-10-21 LAB — COMPREHENSIVE METABOLIC PANEL
ALBUMIN: 4.4 g/dL (ref 3.5–5.0)
ALT: 15 U/L (ref 14–54)
ANION GAP: 9 (ref 5–15)
AST: 26 U/L (ref 15–41)
Alkaline Phosphatase: 48 U/L (ref 38–126)
BILIRUBIN TOTAL: 0.4 mg/dL (ref 0.3–1.2)
BUN: 30 mg/dL — ABNORMAL HIGH (ref 6–20)
CO2: 27 mmol/L (ref 22–32)
Calcium: 9.8 mg/dL (ref 8.9–10.3)
Chloride: 104 mmol/L (ref 101–111)
Creatinine, Ser: 0.75 mg/dL (ref 0.44–1.00)
GFR calc Af Amer: >60 mL/min (ref >60)
GFR calc non Af Amer: >60 mL/min (ref >60)
GLUCOSE: 101 mg/dL — AB (ref 65–99)
POTASSIUM: 3.9 mmol/L (ref 3.5–5.1)
SODIUM: 140 mmol/L (ref 135–145)
Total Protein: 7 g/dL (ref 6.5–8.1)

## 2016-10-21 LAB — CBC WITH DIFFERENTIAL/PLATELET
BASOS PCT: 1 %
Basophils Absolute: 0 10*3/uL (ref 0.0–0.1)
EOS ABS: 0.1 10*3/uL (ref 0.0–0.7)
Eosinophils Relative: 2 %
HCT: 39.1 % (ref 36.0–46.0)
Hemoglobin: 13 g/dL (ref 12.0–15.0)
Lymphocytes Relative: 16 %
Lymphs Abs: 0.9 10*3/uL (ref 0.7–4.0)
MCH: 28.4 pg (ref 26.0–34.0)
MCHC: 33.2 g/dL (ref 30.0–36.0)
MCV: 85.4 fL (ref 78.0–100.0)
MONO ABS: 0.3 10*3/uL (ref 0.1–1.0)
MONOS PCT: 5 %
Neutro Abs: 4.2 10*3/uL (ref 1.7–7.7)
Neutrophils Relative %: 76 %
Platelets: 190 10*3/uL (ref 150–400)
RBC: 4.58 MIL/uL (ref 3.87–5.11)
RDW: 13.6 % (ref 11.5–15.5)
WBC: 5.4 10*3/uL (ref 4.0–10.5)

## 2016-10-21 LAB — TYPE AND SCREEN
ABO/RH(D): O NEG
ANTIBODY SCREEN: NEGATIVE

## 2016-10-21 LAB — ABO/RH: ABO/RH(D): O NEG

## 2016-10-21 LAB — SURGICAL PCR SCREEN
MRSA, PCR: POSITIVE — AB
Staphylococcus aureus: POSITIVE — AB

## 2016-10-21 LAB — APTT: aPTT: 32 seconds (ref 24–36)

## 2016-10-21 LAB — PROTIME-INR
INR: 1.12
Prothrombin Time: 14.4 seconds (ref 11.4–15.2)

## 2016-10-21 NOTE — Pre-Procedure Instructions (Signed)
    April Hernandez  10/21/2016      [facility] [address] - [place], [place] - [address] [place] [address] Phone: [phone] Fax: [phone]  [facility], [place] [address] [place] [address] Phone: [phone] Fax: [phone]    Your procedure is scheduled on 10/25/16.  Report to [facility] Admitting at 530 A.M.  Call this number if you have problems the morning of surgery:  [phone]   Remember:  Do not eat food or drink liquids after midnight.  Take these medicines the morning of surgery with A SIP OF WATER --norvasc   Do not wear jewelry, make-up or nail polish.  Do not wear lotions, powders, or perfumes, or deoderant.  Do not shave 48 hours prior to surgery.  Men may shave face and neck.  Do not bring valuables to the hospital.  [facility] is not responsible for any belongings or valuables.  Contacts, dentures or bridgework may not be worn into surgery.  Leave your suitcase in the car.  After surgery it may be brought to your room.  For patients admitted to the hospital, discharge time will be determined by your treatment team.  Patients discharged the day of surgery will not be allowed to drive home.   Name and phone number of your driver:    Special instructions:  Do not take any aspirin,anti-inflammatories,vitamins,or herbal supplements 5-7 days prior to surgery.  Please read over the following fact sheets that you were given. MRSA Information

## 2016-10-22 NOTE — Progress Notes (Signed)
Anesthesia Chart Review:  Pt is a 76 year old female scheduled for L total knee arthroplasty on 10/25/2016 with Dorna Leitz, M.D.  - PCP is Lavone Orn, MD  - Pt saw Minus Breeding, MD with cardiology 02/15/14 for possible MVP. Dr. Percival Spanish felt MVP was mild and no further work up for it or chronic RBBB was recommended.   PMH includes: PE (2012), factor V Leiden mutation, HTN. Never smoker. BMI 21.  Medications include: Amlodipine, iron, xarelto.   Preoperative labs reviewed.    EKG 10/21/16: NSR. RBBB with ST-T abnormality. Appears stable compared to EKG 01/06/14.   Echo 01/17/14:  - Left ventricle: The cavity size was normal. Systolic function wasnormal. The estimated ejection fraction was in the range of 60% to 65%. Wall motion was normal; there were no regional wallmotion abnormalities. 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 - Atrial septum: No defect or patent foramen ovale was identified.  If no changes, I anticipate pt can proceed with surgery as scheduled.   Willeen Cass, FNP-BC [facility]/Anesthesiology Phone: [phone] 10/22/2016 3:49 PM

## 2016-10-23 ENCOUNTER — Other Ambulatory Visit: Payer: Self-pay | Admitting: Orthopedic Surgery

## 2016-10-24 NOTE — H&P (Addendum)
TOTAL KNEE ADMISSION H&P  Patient is being admitted for left total knee arthroplasty.  Subjective:  Chief Complaint:left knee pain.  HPI: April Hernandez, 76 y.o. female, has a history of pain and functional disability in the left knee due to arthritis and has failed non-surgical conservative treatments for greater than 12 weeks to includeNSAID's and/or analgesics, corticosteriod injections, viscosupplementation injections, use of assistive devices, weight reduction as appropriate and activity modification.  Onset of symptoms was gradual, starting 4 years ago with gradually worsening course since that time. The patient noted no past surgery on the left knee(s).  Patient currently rates pain in the left knee(s) at 8 out of 10 with activity. Patient has night pain, worsening of pain with activity and weight bearing, pain that interferes with activities of daily living, pain with passive range of motion, crepitus and joint swelling.  Patient has evidence of subchondral cysts, periarticular osteophytes, joint subluxation and joint space narrowing by imaging studies. This patient has had ffailure of all reasonable conservative care. There is no active infection.Pt has significant medical problems including history of dvt,Pe and UTI.  Pt will need stay of greater than 2 midnights.  Patient Active Problem List   Diagnosis Date Noted  . Dyspnea 01/11/2014  . Pulmonary embolism ([place]) 06/07/2011   Past Medical History:  Diagnosis Date  . Arthritis   . Factor 5 Leiden mutation, heterozygous ([place])   . Hypertension   . Pulmonary embolism ([place]) 11/2010    Past Surgical History:  Procedure Laterality Date  . ANKLE FRACTURE SURGERY    . kideny stones removed      No prescriptions prior to admission.   Allergies  Allergen Reactions  . Lobster [Shellfish Allergy] Anaphylaxis and Rash  . Alendronate Rash    Social History  Substance Use Topics  . Smoking status: Never Smoker  . Smokeless tobacco:  Not on file  . Alcohol use Yes     Comment: occas    Family History  Problem Relation Age of Onset  . Heart murmur Mother   . Heart failure Mother   . Lung cancer Maternal Aunt   . Clotting disorder Father 70    Died of PE  . Breast cancer Maternal Aunt      ROS ROS: I have reviewed the patient's review of systems thoroughly and there are no positive responses as relates to the HPI. Objective:  Physical Exam  Vital signs in last 24 hours:    Vitals:   10/25/16 0600  BP: (!) 167/72  Pulse: 73  Resp: 18  Temp: 97.9 F (36.6 C)   Well-developed well-nourished patient in no acute distress. Alert and oriented x3 HEENT:within normal limits Cardiac: Regular rate and rhythm Pulmonary: Lungs clear to auscultation Abdomen: Soft and nontender.  Normal active bowel sounds  Musculoskeletal: (left knee: Obvious spinal alignment.  Pain to range of motion.  No instability.  Trace effusion.) Labs: Recent Results (from the past 2160 hour(s))  Surgical pcr screen     Status: Abnormal   Collection Time: 10/21/16 11:15 AM  Result Value Ref Range   MRSA, PCR POSITIVE (A) NEGATIVE    Comment: RESULT CALLED TO, READ BACK BY AND VERIFIED WITH: Shawnie Pons RN 14:00 10/21/16 ([email or web address])    Staphylococcus aureus POSITIVE (A) NEGATIVE    Comment:        The Xpert SA Assay (FDA approved for NASAL specimens in patients over 15 years of age), is one component of a comprehensive surveillance program.  Test  performance has been validated by [facility] for patients greater than or equal to 40 year old. It is not intended to diagnose infection nor to guide or monitor treatment.   APTT     Status: None   Collection Time: 10/21/16 11:16 AM  Result Value Ref Range   aPTT 32 24 - 36 seconds  CBC WITH DIFFERENTIAL     Status: None   Collection Time: 10/21/16 11:16 AM  Result Value Ref Range   WBC 5.4 4.0 - 10.5 K/uL   RBC 4.58 3.87 - 5.11 MIL/uL   Hemoglobin 13.0 12.0 - 15.0 g/dL   HCT 39.1  36.0 - 46.0 %   MCV 85.4 78.0 - 100.0 fL   MCH 28.4 26.0 - 34.0 pg   MCHC 33.2 30.0 - 36.0 g/dL   RDW 13.6 11.5 - 15.5 %   Platelets 190 150 - 400 K/uL   Neutrophils Relative % 76 %   Neutro Abs 4.2 1.7 - 7.7 K/uL   Lymphocytes Relative 16 %   Lymphs Abs 0.9 0.7 - 4.0 K/uL   Monocytes Relative 5 %   Monocytes Absolute 0.3 0.1 - 1.0 K/uL   Eosinophils Relative 2 %   Eosinophils Absolute 0.1 0.0 - 0.7 K/uL   Basophils Relative 1 %   Basophils Absolute 0.0 0.0 - 0.1 K/uL  Comprehensive metabolic panel     Status: Abnormal   Collection Time: 10/21/16 11:16 AM  Result Value Ref Range   Sodium 140 135 - 145 mmol/L   Potassium 3.9 3.5 - 5.1 mmol/L   Chloride 104 101 - 111 mmol/L   CO2 27 22 - 32 mmol/L   Glucose, Bld 101 (H) 65 - 99 mg/dL   BUN 30 (H) 6 - 20 mg/dL   Creatinine, Ser 0.75 0.44 - 1.00 mg/dL   Calcium 9.8 8.9 - 10.3 mg/dL   Total Protein 7.0 6.5 - 8.1 g/dL   Albumin 4.4 3.5 - 5.0 g/dL   AST 26 15 - 41 U/L   ALT 15 14 - 54 U/L   Alkaline Phosphatase 48 38 - 126 U/L   Total Bilirubin 0.4 0.3 - 1.2 mg/dL   GFR calc non Af Amer >60 >60 mL/min   GFR calc Af Amer >60 >60 mL/min    Comment: (NOTE) The eGFR has been calculated using the CKD EPI equation. This calculation has not been validated in all clinical situations. eGFR's persistently <60 mL/min signify possible Chronic Kidney Disease.    Anion gap 9 5 - 15  Protime-INR     Status: None   Collection Time: 10/21/16 11:16 AM  Result Value Ref Range   Prothrombin Time 14.4 11.4 - 15.2 seconds   INR 1.12   Urinalysis, Routine w reflex microscopic     Status: Abnormal   Collection Time: 10/21/16 11:16 AM  Result Value Ref Range   Color, Urine YELLOW YELLOW   APPearance CLEAR CLEAR   Specific Gravity, Urine 1.006 1.005 - 1.030   pH 6.0 5.0 - 8.0   Glucose, UA NEGATIVE NEGATIVE mg/dL   Hgb urine dipstick SMALL (A) NEGATIVE   Bilirubin Urine NEGATIVE NEGATIVE   Ketones, ur NEGATIVE NEGATIVE mg/dL   Protein, ur  NEGATIVE NEGATIVE mg/dL   Nitrite NEGATIVE NEGATIVE   Leukocytes, UA SMALL (A) NEGATIVE   RBC / HPF 0-5 0 - 5 RBC/hpf   WBC, UA 0-5 0 - 5 WBC/hpf   Bacteria, UA RARE (A) NONE SEEN   Squamous Epithelial / LPF NONE  SEEN NONE SEEN   Amorphous Crystal PRESENT   Type and screen Order type and screen if day of surgery is less than 15 days from draw of preadmission visit or order morning of surgery if day of surgery is greater than 6 days from preadmission visit.     Status: None   Collection Time: 10/21/16 11:32 AM  Result Value Ref Range   ABO/RH(D) O NEG    Antibody Screen NEG    Sample Expiration 11/04/2016    Extend sample reason NO TRANSFUSIONS OR PREGNANCY IN THE PAST 3 MONTHS   ABO/Rh     Status: None   Collection Time: 10/21/16 11:32 AM  Result Value Ref Range   ABO/RH(D) O NEG     Estimated body mass index is 21.13 kg/m as calculated from the following:   Height as of 10/21/16: 5' 2.5" (1.588 m).   Weight as of 10/21/16: 53.3 kg (117 lb 6.4 oz).   Imaging Review Plain radiographs demonstrate severe degenerative joint disease of the left knee(s). The overall alignment ismild valgus. The bone quality appears to be fair for age and reported activity level.  Assessment/Plan:  End stage arthritis, left knee   The patient history, physical examination, clinical judgment of the provider and imaging studies are consistent with end stage degenerative joint disease of the left knee(s) and total knee arthroplasty is deemed medically necessary. The treatment options including medical management, injection therapy arthroscopy and arthroplasty were discussed at length. The risks and benefits of total knee arthroplasty were presented and reviewed. The risks due to aseptic loosening, infection, stiffness, patella tracking problems, thromboembolic complications and other imponderables were discussed. The patient acknowledged the explanation, agreed to proceed with the plan and consent was signed.  Patient is being admitted for inpatient treatment for surgery, pain control, PT, OT, prophylactic antibiotics, VTE prophylaxis, progressive ambulation and ADL's and discharge planning. The patient is planning to be discharged home with home health services

## 2016-10-25 ENCOUNTER — Encounter (HOSPITAL_COMMUNITY): Payer: Self-pay | Admitting: Urology

## 2016-10-25 ENCOUNTER — Encounter (HOSPITAL_COMMUNITY)
Admission: RE | Disposition: A | Discharge status: Home Health | Payer: Self-pay | Source: Ambulatory Visit | Attending: Orthopedic Surgery

## 2016-10-25 ENCOUNTER — Inpatient Hospital Stay (HOSPITAL_COMMUNITY): Payer: Medicare Other

## 2016-10-25 ENCOUNTER — Inpatient Hospital Stay (HOSPITAL_COMMUNITY): Payer: Medicare Other | Admitting: Emergency Medicine

## 2016-10-25 ENCOUNTER — Inpatient Hospital Stay (HOSPITAL_COMMUNITY): Payer: Medicare Other | Admitting: Anesthesiology

## 2016-10-25 ENCOUNTER — Inpatient Hospital Stay (HOSPITAL_COMMUNITY)
Admission: RE | Admit: 2016-10-25 | Discharge: 2016-10-27 | DRG: 470 | Disposition: A | Discharge status: Home Health | Payer: Medicare Other | Source: Ambulatory Visit | Attending: Orthopedic Surgery | Admitting: Orthopedic Surgery

## 2016-10-25 DIAGNOSIS — Z832 Family history of diseases of the blood and blood-forming organs and certain disorders involving the immune mechanism: Secondary | ICD-10-CM | POA: Diagnosis not present

## 2016-10-25 DIAGNOSIS — Z01818 Encounter for other preprocedural examination: Secondary | ICD-10-CM

## 2016-10-25 DIAGNOSIS — M1712 Unilateral primary osteoarthritis, left knee: Secondary | ICD-10-CM | POA: Diagnosis not present

## 2016-10-25 DIAGNOSIS — Z85820 Personal history of malignant melanoma of skin: Secondary | ICD-10-CM

## 2016-10-25 DIAGNOSIS — G8918 Other acute postprocedural pain: Secondary | ICD-10-CM | POA: Diagnosis not present

## 2016-10-25 DIAGNOSIS — Z91013 Allergy to seafood: Secondary | ICD-10-CM

## 2016-10-25 DIAGNOSIS — Z888 Allergy status to other drugs, medicaments and biological substances status: Secondary | ICD-10-CM

## 2016-10-25 DIAGNOSIS — Z7901 Long term (current) use of anticoagulants: Secondary | ICD-10-CM | POA: Diagnosis not present

## 2016-10-25 DIAGNOSIS — Z86711 Personal history of pulmonary embolism: Secondary | ICD-10-CM

## 2016-10-25 DIAGNOSIS — Z86718 Personal history of other venous thrombosis and embolism: Secondary | ICD-10-CM

## 2016-10-25 DIAGNOSIS — D6851 Activated protein C resistance: Secondary | ICD-10-CM | POA: Diagnosis present

## 2016-10-25 DIAGNOSIS — Z96659 Presence of unspecified artificial knee joint: Secondary | ICD-10-CM | POA: Diagnosis not present

## 2016-10-25 DIAGNOSIS — I10 Essential (primary) hypertension: Secondary | ICD-10-CM | POA: Diagnosis present

## 2016-10-25 DIAGNOSIS — R0600 Dyspnea, unspecified: Secondary | ICD-10-CM | POA: Diagnosis not present

## 2016-10-25 DIAGNOSIS — M25562 Pain in left knee: Secondary | ICD-10-CM | POA: Diagnosis not present

## 2016-10-25 DIAGNOSIS — Z471 Aftercare following joint replacement surgery: Secondary | ICD-10-CM | POA: Diagnosis not present

## 2016-10-25 HISTORY — DX: Personal history of urinary calculi: Z87.442

## 2016-10-25 HISTORY — PX: TOTAL KNEE ARTHROPLASTY: SHX125

## 2016-10-25 HISTORY — DX: Malignant melanoma of skin, unspecified: C43.9

## 2016-10-25 LAB — PROTIME-INR
INR: 1
PROTHROMBIN TIME: 13.2 s (ref 11.4–15.2)

## 2016-10-25 SURGERY — ARTHROPLASTY, KNEE, TOTAL
Anesthesia: Spinal | Site: Knee | Laterality: Left

## 2016-10-25 MED ORDER — HYDROMORPHONE HCL 1 MG/ML IJ SOLN
INTRAMUSCULAR | Status: AC
  Filled 2016-10-25: qty 0.5

## 2016-10-25 MED ORDER — BISACODYL 5 MG PO TBEC: 5.0000 mg | DELAYED_RELEASE_TABLET | Freq: Every day | ORAL | Status: DC | PRN

## 2016-10-25 MED ORDER — TRANEXAMIC ACID 1000 MG/10ML IV SOLN
2000.0000 mg | Freq: Once | INTRAVENOUS | Status: AC
  Administered 2016-10-25: 2000 mg via TOPICAL
  Filled 2016-10-25: qty 20

## 2016-10-25 MED ORDER — PROMETHAZINE HCL 25 MG/ML IJ SOLN: 6.2500 mg | Freq: Four times a day (QID) | INTRAMUSCULAR | Status: DC | PRN

## 2016-10-25 MED ORDER — DEXAMETHASONE SODIUM PHOSPHATE 10 MG/ML IJ SOLN
10.0000 mg | Freq: Two times a day (BID) | INTRAMUSCULAR | Status: AC
  Administered 2016-10-25 – 2016-10-26 (×2): 10 mg via INTRAVENOUS
  Filled 2016-10-25 (×3): qty 1

## 2016-10-25 MED ORDER — LIDOCAINE 2% (20 MG/ML) 5 ML SYRINGE
INTRAMUSCULAR | Status: AC
  Filled 2016-10-25: qty 5

## 2016-10-25 MED ORDER — PROPOFOL 10 MG/ML IV BOLUS
INTRAVENOUS | Status: AC
  Filled 2016-10-25: qty 20

## 2016-10-25 MED ORDER — FERROUS SULFATE DRIED ER 45 MG PO TBCR: 45.0000 mg | EXTENDED_RELEASE_TABLET | Freq: Every day | ORAL | Status: DC

## 2016-10-25 MED ORDER — PROPOFOL 10 MG/ML IV BOLUS
INTRAVENOUS | Status: DC | PRN
  Administered 2016-10-25: 20 mg via INTRAVENOUS

## 2016-10-25 MED ORDER — OXYCODONE-ACETAMINOPHEN 5-325 MG PO TABS: 1.0000 | ORAL_TABLET | Freq: Four times a day (QID) | ORAL | 0 refills | Status: AC | PRN

## 2016-10-25 MED ORDER — DOCUSATE SODIUM 100 MG PO CAPS: 100.0000 mg | ORAL_CAPSULE | Freq: Two times a day (BID) | ORAL | 0 refills | Status: AC

## 2016-10-25 MED ORDER — ONDANSETRON HCL 4 MG/2ML IJ SOLN
INTRAMUSCULAR | Status: DC | PRN
  Administered 2016-10-25: 4 mg via INTRAVENOUS

## 2016-10-25 MED ORDER — METHOCARBAMOL 1000 MG/10ML IJ SOLN
500.0000 mg | Freq: Four times a day (QID) | INTRAVENOUS | Status: DC | PRN
  Filled 2016-10-25: qty 5

## 2016-10-25 MED ORDER — DEXAMETHASONE SODIUM PHOSPHATE 10 MG/ML IJ SOLN
INTRAMUSCULAR | Status: AC
  Filled 2016-10-25: qty 1

## 2016-10-25 MED ORDER — MAGNESIUM CITRATE PO SOLN: 1.0000 | Freq: Once | ORAL | Status: DC | PRN

## 2016-10-25 MED ORDER — OXYCODONE HCL 5 MG PO TABS
ORAL_TABLET | ORAL | Status: AC
  Administered 2016-10-25: 10 mg via ORAL
  Filled 2016-10-25: qty 2

## 2016-10-25 MED ORDER — VANCOMYCIN HCL IN DEXTROSE 1-5 GM/200ML-% IV SOLN
INTRAVENOUS | Status: AC
  Filled 2016-10-25: qty 200

## 2016-10-25 MED ORDER — BUPIVACAINE-EPINEPHRINE (PF) 0.5% -1:200000 IJ SOLN
INTRAMUSCULAR | Status: DC | PRN
  Administered 2016-10-25: 25 mL via PERINEURAL

## 2016-10-25 MED ORDER — DOCUSATE SODIUM 100 MG PO CAPS
100.0000 mg | ORAL_CAPSULE | Freq: Two times a day (BID) | ORAL | Status: DC
  Administered 2016-10-25 – 2016-10-27 (×5): 100 mg via ORAL
  Filled 2016-10-25 (×5): qty 1

## 2016-10-25 MED ORDER — FENTANYL CITRATE (PF) 100 MCG/2ML IJ SOLN
INTRAMUSCULAR | Status: DC | PRN
  Administered 2016-10-25: 50 ug via INTRAVENOUS

## 2016-10-25 MED ORDER — TRANEXAMIC ACID 1000 MG/10ML IV SOLN: 1000.0000 mg | INTRAVENOUS | Status: DC

## 2016-10-25 MED ORDER — ONDANSETRON HCL 4 MG/2ML IJ SOLN
INTRAMUSCULAR | Status: AC
  Filled 2016-10-25: qty 2

## 2016-10-25 MED ORDER — TIZANIDINE HCL 2 MG PO TABS: 2.0000 mg | ORAL_TABLET | Freq: Three times a day (TID) | ORAL | 0 refills | Status: AC | PRN

## 2016-10-25 MED ORDER — FERROUS SULFATE 325 (65 FE) MG PO TABS
325.0000 mg | ORAL_TABLET | Freq: Every day | ORAL | Status: DC
  Administered 2016-10-26 – 2016-10-27 (×2): 325 mg via ORAL
  Filled 2016-10-25 (×2): qty 1

## 2016-10-25 MED ORDER — MIDAZOLAM HCL 5 MG/5ML IJ SOLN
INTRAMUSCULAR | Status: DC | PRN
  Administered 2016-10-25 (×2): 1 mg via INTRAVENOUS

## 2016-10-25 MED ORDER — FENTANYL CITRATE (PF) 250 MCG/5ML IJ SOLN
INTRAMUSCULAR | Status: AC
  Filled 2016-10-25: qty 5

## 2016-10-25 MED ORDER — LACTATED RINGERS IV SOLN
INTRAVENOUS | Status: DC | PRN
  Administered 2016-10-25 (×2): via INTRAVENOUS

## 2016-10-25 MED ORDER — VANCOMYCIN HCL IN DEXTROSE 1-5 GM/200ML-% IV SOLN
1000.0000 mg | Freq: Two times a day (BID) | INTRAVENOUS | Status: AC
  Administered 2016-10-26: 1000 mg via INTRAVENOUS
  Filled 2016-10-25 (×2): qty 200

## 2016-10-25 MED ORDER — RIVAROXABAN 20 MG PO TABS
20.0000 mg | ORAL_TABLET | Freq: Every day | ORAL | Status: DC
  Filled 2016-10-25: qty 1

## 2016-10-25 MED ORDER — BUPIVACAINE HCL (PF) 0.75 % IJ SOLN
INTRAMUSCULAR | Status: DC | PRN
  Administered 2016-10-25: 1.5 mL via INTRATHECAL

## 2016-10-25 MED ORDER — HYDROMORPHONE HCL 1 MG/ML IJ SOLN: 0.5000 mg | INTRAMUSCULAR | Status: DC | PRN

## 2016-10-25 MED ORDER — METHOCARBAMOL 500 MG PO TABS
500.0000 mg | ORAL_TABLET | Freq: Four times a day (QID) | ORAL | Status: DC | PRN
  Administered 2016-10-25 – 2016-10-26 (×2): 500 mg via ORAL
  Filled 2016-10-25: qty 1

## 2016-10-25 MED ORDER — ALUM & MAG HYDROXIDE-SIMETH 200-200-20 MG/5ML PO SUSP: 30.0000 mL | ORAL | Status: DC | PRN

## 2016-10-25 MED ORDER — FENTANYL CITRATE (PF) 250 MCG/5ML IJ SOLN
INTRAMUSCULAR | Status: AC
Start: 2016-10-25 — End: 2016-10-25
  Filled 2016-10-25: qty 5

## 2016-10-25 MED ORDER — MIDAZOLAM HCL 2 MG/2ML IJ SOLN
INTRAMUSCULAR | Status: AC
  Filled 2016-10-25: qty 2

## 2016-10-25 MED ORDER — DEXAMETHASONE SODIUM PHOSPHATE 10 MG/ML IJ SOLN
INTRAMUSCULAR | Status: DC | PRN
  Administered 2016-10-25: 10 mg via INTRAVENOUS

## 2016-10-25 MED ORDER — PHENYLEPHRINE HCL 10 MG/ML IJ SOLN
INTRAVENOUS | Status: DC | PRN
  Administered 2016-10-25: 20 ug/min via INTRAVENOUS

## 2016-10-25 MED ORDER — CHLORHEXIDINE GLUCONATE 4 % EX LIQD: 60.0000 mL | Freq: Once | CUTANEOUS | Status: DC

## 2016-10-25 MED ORDER — ONDANSETRON HCL 4 MG/2ML IJ SOLN: 4.0000 mg | Freq: Four times a day (QID) | INTRAMUSCULAR | Status: DC | PRN

## 2016-10-25 MED ORDER — METHOCARBAMOL 500 MG PO TABS
ORAL_TABLET | ORAL | Status: AC
  Administered 2016-10-25: 500 mg via ORAL
  Filled 2016-10-25: qty 1

## 2016-10-25 MED ORDER — GABAPENTIN 300 MG PO CAPS
300.0000 mg | ORAL_CAPSULE | Freq: Two times a day (BID) | ORAL | Status: DC
  Administered 2016-10-25 – 2016-10-27 (×4): 300 mg via ORAL
  Filled 2016-10-25 (×4): qty 1

## 2016-10-25 MED ORDER — BUPIVACAINE LIPOSOME 1.3 % IJ SUSP
20.0000 mL | Freq: Once | INTRAMUSCULAR | Status: AC
  Administered 2016-10-25: 20 mL
  Filled 2016-10-25: qty 20

## 2016-10-25 MED ORDER — BUPIVACAINE HCL (PF) 0.5 % IJ SOLN
INTRAMUSCULAR | Status: AC
  Filled 2016-10-25: qty 30

## 2016-10-25 MED ORDER — BUPIVACAINE HCL (PF) 0.5 % IJ SOLN
INTRAMUSCULAR | Status: DC | PRN
  Administered 2016-10-25: 30 mL

## 2016-10-25 MED ORDER — OXYCODONE HCL 5 MG PO TABS
5.0000 mg | ORAL_TABLET | ORAL | Status: DC | PRN
  Administered 2016-10-25 – 2016-10-26 (×2): 10 mg via ORAL
  Filled 2016-10-25: qty 2

## 2016-10-25 MED ORDER — CELECOXIB 200 MG PO CAPS
200.0000 mg | ORAL_CAPSULE | Freq: Two times a day (BID) | ORAL | Status: DC
  Administered 2016-10-25 – 2016-10-27 (×4): 200 mg via ORAL
  Filled 2016-10-25 (×4): qty 1

## 2016-10-25 MED ORDER — SODIUM CHLORIDE 0.9 % IJ SOLN
INTRAMUSCULAR | Status: DC | PRN
  Administered 2016-10-25: 40 mL

## 2016-10-25 MED ORDER — VANCOMYCIN HCL IN DEXTROSE 1-5 GM/200ML-% IV SOLN
1000.0000 mg | INTRAVENOUS | Status: AC
  Administered 2016-10-25: 1000 mg via INTRAVENOUS

## 2016-10-25 MED ORDER — 0.9 % SODIUM CHLORIDE (POUR BTL) OPTIME
TOPICAL | Status: DC | PRN
  Administered 2016-10-25: 1000 mL

## 2016-10-25 MED ORDER — PROPOFOL 500 MG/50ML IV EMUL
INTRAVENOUS | Status: DC | PRN
  Administered 2016-10-25: 75 ug/kg/min via INTRAVENOUS

## 2016-10-25 MED ORDER — PROMETHAZINE HCL 25 MG/ML IJ SOLN: 6.2500 mg | INTRAMUSCULAR | Status: DC | PRN

## 2016-10-25 MED ORDER — DIPHENHYDRAMINE HCL 12.5 MG/5ML PO ELIX: 12.5000 mg | ORAL_SOLUTION | ORAL | Status: DC | PRN

## 2016-10-25 MED ORDER — SODIUM CHLORIDE 0.9 % IV SOLN
INTRAVENOUS | Status: DC
  Administered 2016-10-25: 15:00:00 via INTRAVENOUS

## 2016-10-25 MED ORDER — POLYETHYLENE GLYCOL 3350 17 G PO PACK: 17.0000 g | PACK | Freq: Every day | ORAL | Status: DC | PRN

## 2016-10-25 MED ORDER — ACETAMINOPHEN 325 MG PO TABS
650.0000 mg | ORAL_TABLET | Freq: Four times a day (QID) | ORAL | Status: DC | PRN
  Administered 2016-10-26: 650 mg via ORAL
  Filled 2016-10-25: qty 2

## 2016-10-25 MED ORDER — ONDANSETRON HCL 4 MG PO TABS: 4.0000 mg | ORAL_TABLET | Freq: Four times a day (QID) | ORAL | Status: DC | PRN

## 2016-10-25 MED ORDER — AMLODIPINE BESYLATE 5 MG PO TABS
5.0000 mg | ORAL_TABLET | Freq: Every day | ORAL | Status: DC
Start: 2016-10-26 — End: 2016-10-27
  Administered 2016-10-26 – 2016-10-27 (×2): 5 mg via ORAL
  Filled 2016-10-25 (×2): qty 1

## 2016-10-25 MED ORDER — ACETAMINOPHEN 650 MG RE SUPP: 650.0000 mg | Freq: Four times a day (QID) | RECTAL | Status: DC | PRN

## 2016-10-25 MED ORDER — MUPIROCIN 2 % EX OINT
1.0000 "application " | TOPICAL_OINTMENT | Freq: Once | CUTANEOUS | Status: AC
  Administered 2016-10-25: 1 via TOPICAL

## 2016-10-25 MED ORDER — HYDROMORPHONE HCL 1 MG/ML IJ SOLN
INTRAMUSCULAR | Status: AC
  Administered 2016-10-25: 0.5 mg via INTRAVENOUS
  Filled 2016-10-25: qty 0.5

## 2016-10-25 MED ORDER — HYDROMORPHONE HCL 1 MG/ML IJ SOLN
0.2500 mg | INTRAMUSCULAR | Status: DC | PRN
  Administered 2016-10-25 (×2): 0.5 mg via INTRAVENOUS

## 2016-10-25 MED ORDER — MUPIROCIN 2 % EX OINT
TOPICAL_OINTMENT | CUTANEOUS | Status: AC
  Administered 2016-10-25: 1 via TOPICAL
  Filled 2016-10-25: qty 22

## 2016-10-25 SURGICAL SUPPLY — 59 items
BANDAGE ESMARK 6X9 LF (GAUZE/BANDAGES/DRESSINGS) ×1 IMPLANT
BENZOIN TINCTURE PRP APPL 2/3 (GAUZE/BANDAGES/DRESSINGS) ×3 IMPLANT
BLADE SAGITTAL 25.0X1.19X90 (BLADE) ×2 IMPLANT
BLADE SAGITTAL 25.0X1.19X90MM (BLADE) ×1
BLADE SAW SAG 90X13X1.27 (BLADE) ×3 IMPLANT
BNDG ESMARK 6X9 LF (GAUZE/BANDAGES/DRESSINGS) ×3
BOWL SMART MIX CTS (DISPOSABLE) ×3 IMPLANT
CAPT KNEE TOTAL 3 ATTUNE ×3 IMPLANT
CEMENT HV SMART SET (Cement) ×6 IMPLANT
CLOSURE STERI-STRIP 1/2X4 (GAUZE/BANDAGES/DRESSINGS) ×1
CLOSURE WOUND 1/2 X4 (GAUZE/BANDAGES/DRESSINGS) ×1
CLSR STERI-STRIP ANTIMIC 1/2X4 (GAUZE/BANDAGES/DRESSINGS) ×2 IMPLANT
COVER SURGICAL LIGHT HANDLE (MISCELLANEOUS) ×3 IMPLANT
CUFF TOURNIQUET SINGLE 34IN LL (TOURNIQUET CUFF) ×3 IMPLANT
CUFF TOURNIQUET SINGLE 44IN (TOURNIQUET CUFF) IMPLANT
DRAPE EXTREMITY T 121X128X90 (DRAPE) ×3 IMPLANT
DRAPE U-SHAPE 47X51 STRL (DRAPES) ×3 IMPLANT
DRSG AQUACEL AG ADV 3.5X10 (GAUZE/BANDAGES/DRESSINGS) ×3 IMPLANT
DRSG AQUACEL AG ADV 3.5X14 (GAUZE/BANDAGES/DRESSINGS) ×3 IMPLANT
DRSG PAD ABDOMINAL 8X10 ST (GAUZE/BANDAGES/DRESSINGS) ×3 IMPLANT
DURAPREP 26ML APPLICATOR (WOUND CARE) ×3 IMPLANT
ELECT REM PT RETURN 9FT ADLT (ELECTROSURGICAL) ×3
ELECTRODE REM PT RTRN 9FT ADLT (ELECTROSURGICAL) ×1 IMPLANT
EVACUATOR 1/8 PVC DRAIN (DRAIN) IMPLANT
FACESHIELD WRAPAROUND (MASK) ×3 IMPLANT
GAUZE SPONGE 4X4 12PLY STRL (GAUZE/BANDAGES/DRESSINGS) ×3 IMPLANT
GLOVE BIOGEL PI IND STRL 8 (GLOVE) ×2 IMPLANT
GLOVE BIOGEL PI INDICATOR 8 (GLOVE) ×4
GLOVE ECLIPSE 7.5 STRL STRAW (GLOVE) ×6 IMPLANT
GOWN STRL REUS W/ TWL LRG LVL3 (GOWN DISPOSABLE) ×1 IMPLANT
GOWN STRL REUS W/ TWL XL LVL3 (GOWN DISPOSABLE) ×2 IMPLANT
GOWN STRL REUS W/TWL LRG LVL3 (GOWN DISPOSABLE) ×2
GOWN STRL REUS W/TWL XL LVL3 (GOWN DISPOSABLE) ×4
HANDPIECE INTERPULSE COAX TIP (DISPOSABLE) ×2
HOOD PEEL AWAY FACE SHEILD DIS (HOOD) ×12 IMPLANT
IMMOBILIZER KNEE 22 UNIV (SOFTGOODS) ×3 IMPLANT
KIT BASIN OR (CUSTOM PROCEDURE TRAY) ×3 IMPLANT
KIT ROOM TURNOVER OR (KITS) ×3 IMPLANT
MANIFOLD NEPTUNE II (INSTRUMENTS) ×3 IMPLANT
NEEDLE 22X1 1/2 (OR ONLY) (NEEDLE) ×3 IMPLANT
NS IRRIG 1000ML POUR BTL (IV SOLUTION) ×3 IMPLANT
PACK TOTAL JOINT (CUSTOM PROCEDURE TRAY) ×3 IMPLANT
PAD ARMBOARD 7.5X6 YLW CONV (MISCELLANEOUS) ×6 IMPLANT
SET HNDPC FAN SPRY TIP SCT (DISPOSABLE) ×1 IMPLANT
STAPLER VISISTAT 35W (STAPLE) IMPLANT
STRIP CLOSURE SKIN 1/2X4 (GAUZE/BANDAGES/DRESSINGS) ×2 IMPLANT
SUCTION FRAZIER HANDLE 10FR (MISCELLANEOUS) ×2
SUCTION TUBE FRAZIER 10FR DISP (MISCELLANEOUS) ×1 IMPLANT
SUT MNCRL AB 3-0 PS2 18 (SUTURE) ×3 IMPLANT
SUT VIC AB 0 CTB1 27 (SUTURE) ×6 IMPLANT
SUT VIC AB 1 CT1 27 (SUTURE) ×4
SUT VIC AB 1 CT1 27XBRD ANBCTR (SUTURE) ×2 IMPLANT
SUT VIC AB 2-0 CTB1 (SUTURE) ×6 IMPLANT
SYR 50ML LL SCALE MARK (SYRINGE) ×3 IMPLANT
TOWEL OR 17X24 6PK STRL BLUE (TOWEL DISPOSABLE) ×3 IMPLANT
TOWEL OR 17X26 10 PK STRL BLUE (TOWEL DISPOSABLE) ×3 IMPLANT
TRAY CATH 16FR W/PLASTIC CATH (SET/KITS/TRAYS/PACK) ×6 IMPLANT
TRAY FOLEY W/METER SILVER 16FR (SET/KITS/TRAYS/PACK) IMPLANT
WRAP KNEE MAXI GEL POST OP (GAUZE/BANDAGES/DRESSINGS) ×3 IMPLANT

## 2016-10-25 NOTE — Progress Notes (Signed)
Orthopedic Tech Progress Note Patient Details:  April Hernandez August 12, 1940 [id number]  CPM Left Knee CPM Left Knee: On Left Knee Flexion (Degrees): 70 Left Knee Extension (Degrees): 0 Additional Comments: trapeze bar patient helper   Hildred Priest 10/25/2016, 10:41 AM Viewed order from doctor's order list

## 2016-10-25 NOTE — Progress Notes (Signed)
Patient's was prescribed cephalexin for UTI, had a night dose and a morning dose left. She requested to continue last two doses. I called the PA and he stated the IV antibiotics are sufficient and PO meds are no longer needed. Relayed that back to the patient and she said that was fine and we could toss the cephalexin in the trash then. Cephalexin has now been discarded.

## 2016-10-25 NOTE — Discharge Instructions (Signed)
INSTRUCTIONS AFTER JOINT REPLACEMENT  ° °o Remove items at home which could result in a fall. This includes throw rugs or furniture in walking pathways °o ICE to the affected joint every three hours while awake for 30 minutes at a time, for at least the first 3-5 days, and then as needed for pain and swelling.  Continue to use ice for pain and swelling. You may notice swelling that will progress down to the foot and ankle.  This is normal after surgery.  Elevate your leg when you are not up walking on it.   °o Continue to use the breathing machine you got in the hospital (incentive spirometer) which will help keep your temperature down.  It is common for your temperature to cycle up and down following surgery, especially at night when you are not up moving around and exerting yourself.  The breathing machine keeps your lungs expanded and your temperature down. ° ° °DIET:  As you were doing prior to hospitalization, we recommend a well-balanced diet. ° °DRESSING / WOUND CARE / SHOWERING ° °Keep the surgical dressing until follow up.  The dressing is water proof, so you can shower without any extra covering.  IF THE DRESSING FALLS OFF or the wound gets wet inside, change the dressing with sterile gauze.  Please use good hand washing techniques before changing the dressing.  Do not use any lotions or creams on the incision until instructed by your surgeon.   ° °ACTIVITY ° °o Increase activity slowly as tolerated, but follow the weight bearing instructions below.   °o No driving for 6 weeks or until further direction given by your physician.  You cannot drive while taking narcotics.  °o No lifting or carrying greater than 10 lbs. until further directed by your surgeon. °o Avoid periods of inactivity such as sitting longer than an hour when not asleep. This helps prevent blood clots.  °o You may return to work once you are authorized by your doctor.  ° ° ° °WEIGHT BEARING  ° °Weight bearing as tolerated with assist  device (walker, cane, etc) as directed, use it as long as suggested by your surgeon or therapist, typically at least 4-6 weeks. ° ° °EXERCISES ° °Results after joint replacement surgery are often greatly improved when you follow the exercise, range of motion and muscle strengthening exercises prescribed by your doctor. Safety measures are also important to protect the joint from further injury. Any time any of these exercises cause you to have increased pain or swelling, decrease what you are doing until you are comfortable again and then slowly increase them. If you have problems or questions, call your caregiver or physical therapist for advice.  ° °Rehabilitation is important following a joint replacement. After just a few days of immobilization, the muscles of the leg can become weakened and shrink (atrophy).  These exercises are designed to build up the tone and strength of the thigh and leg muscles and to improve motion. Often times heat used for twenty to thirty minutes before working out will loosen up your tissues and help with improving the range of motion but do not use heat for the first two weeks following surgery (sometimes heat can increase post-operative swelling).  ° °These exercises can be done on a training (exercise) mat, on the floor, on a table or on a bed. Use whatever works the best and is most comfortable for you.    Use music or television while you are exercising so that   the exercises are a pleasant break in your day. This will make your life better with the exercises acting as a break in your routine that you can look forward to.   Perform all exercises about fifteen times, three times per day or as directed.  You should exercise both the operative leg and the other leg as well. ° °Exercises include: °  °• Quad Sets - Tighten up the muscle on the front of the thigh (Quad) and hold for 5-10 seconds.   °• Straight Leg Raises - With your knee straight (if you were given a brace, keep it on),  lift the leg to 60 degrees, hold for 3 seconds, and slowly lower the leg.  Perform this exercise against resistance later as your leg gets stronger.  °• Leg Slides: Lying on your back, slowly slide your foot toward your buttocks, bending your knee up off the floor (only go as far as is comfortable). Then slowly slide your foot back down until your leg is flat on the floor again.  °• Angel Wings: Lying on your back spread your legs to the side as far apart as you can without causing discomfort.  °• Hamstring Strength:  Lying on your back, push your heel against the floor with your leg straight by tightening up the muscles of your buttocks.  Repeat, but this time bend your knee to a comfortable angle, and push your heel against the floor.  You may put a pillow under the heel to make it more comfortable if necessary.  ° °A rehabilitation program following joint replacement surgery can speed recovery and prevent re-injury in the future due to weakened muscles. Contact your doctor or a physical therapist for more information on knee rehabilitation.  ° ° °CONSTIPATION ° °Constipation is defined medically as fewer than three stools per week and severe constipation as less than one stool per week.  Even if you have a regular bowel pattern at home, your normal regimen is likely to be disrupted due to multiple reasons following surgery.  Combination of anesthesia, postoperative narcotics, change in appetite and fluid intake all can affect your bowels.  ° °YOU MUST use at least one of the following options; they are listed in order of increasing strength to get the job done.  They are all available over the counter, and you may need to use some, POSSIBLY even all of these options:   ° °Drink plenty of fluids (prune juice may be helpful) and high fiber foods °Colace 100 mg by mouth twice a day  °Senokot for constipation as directed and as needed Dulcolax (bisacodyl), take with full glass of water  °Miralax (polyethylene glycol)  once or twice a day as needed. ° °If you have tried all these things and are unable to have a bowel movement in the first 3-4 days after surgery call either your surgeon or your primary doctor.   ° °If you experience loose stools or diarrhea, hold the medications until you stool forms back up.  If your symptoms do not get better within 1 week or if they get worse, check with your doctor.  If you experience "the worst abdominal pain ever" or develop nausea or vomiting, please contact the office immediately for further recommendations for treatment. ° ° °ITCHING:  If you experience itching with your medications, try taking only a single pain pill, or even half a pain pill at a time.  You can also use Benadryl over the counter for itching or also to   help with sleep.   TED HOSE STOCKINGS:  Use stockings on both legs until for at least 2 weeks or as directed by physician office. They may be removed at night for sleeping.  MEDICATIONS:  See your medication summary on the After Visit Summary that nursing will review with you.  You may have some home medications which will be placed on hold until you complete the course of blood thinner medication.  It is important for you to complete the blood thinner medication as prescribed.  PRECAUTIONS:  If you experience chest pain or shortness of breath - call 911 immediately for transfer to the hospital emergency department.   If you develop a fever greater that 101 F, purulent drainage from wound, increased redness or drainage from wound, foul odor from the wound/dressing, or calf pain - CONTACT YOUR SURGEON.                                                   FOLLOW-UP APPOINTMENTS:  If you do not already have a post-op appointment, please call the office for an appointment to be seen by your surgeon.  Guidelines for how soon to be seen are listed in your After Visit Summary, but are typically between 1-4 weeks after surgery.  OTHER INSTRUCTIONS:   Knee  Replacement:  Do not place pillow under knee, focus on keeping the knee straight while resting. CPM instructions: 0-90 degrees, 2 hours in the morning, 2 hours in the afternoon, and 2 hours in the evening. Place foam block, curve side up under heel at all times except when in CPM or when walking.  DO NOT modify, tear, cut, or change the foam block in any way.  MAKE SURE YOU:   Understand these instructions.   Get help right away if you are not doing well or get worse.    Thank you for letting us be a part of your medical care team.  It is a privilege we respect greatly.  We hope these instructions will help you stay on track for a fast and full recovery!      Information on my medicine - XARELTO (rivaroxaban)  This medication education was reviewed with me or my healthcare representative as part of my discharge preparation.  The pharmacist that spoke with me during my hospital stay was:  Saundra Shelling, [place]? Xarelto was prescribed to treat blood clots that may have been found in the veins of your legs (deep vein thrombosis) or in your lungs (pulmonary embolism) and to reduce the risk of them occurring again.  What do you need to know about Xarelto? The dose is one 20 mg tablet taken ONCE A DAY with your evening meal.  DO NOT stop taking Xarelto without talking to the health care provider who prescribed the medication.  Refill your prescription for 20 mg tablets before you run out.  After discharge, you should have regular check-up appointments with your healthcare provider that is prescribing your Xarelto.  In the future your dose may need to be changed if your kidney function changes by a significant amount.  What do you do if you miss a dose? If you are taking Xarelto TWICE DAILY and you miss a dose, take it as soon as you remember. You may take two 15 mg tablets (total  30 mg) at the same time then resume your regularly scheduled 15 mg twice  daily the next day.  If you are taking Xarelto ONCE DAILY and you miss a dose, take it as soon as you remember on the same day then continue your regularly scheduled once daily regimen the next day. Do not take two doses of Xarelto at the same time.   Important Safety Information Xarelto is a blood thinner medicine that can cause bleeding. You should call your healthcare provider right away if you experience any of the following: ? Bleeding from an injury or your nose that does not stop. ? Unusual colored urine (red or dark brown) or unusual colored stools (red or black). ? Unusual bruising for unknown reasons. ? A serious fall or if you hit your head (even if there is no bleeding).  Some medicines may interact with Xarelto and might increase your risk of bleeding while on Xarelto. To help avoid this, consult your healthcare provider or pharmacist prior to using any new prescription or non-prescription medications, including herbals, vitamins, non-steroidal anti-inflammatory drugs (NSAIDs) and supplements.  This website has more information on Xarelto: [email or web address]

## 2016-10-25 NOTE — Anesthesia Procedure Notes (Signed)
Spinal  Patient location during procedure: OR Start time: 10/25/2016 7:35 AM End time: 10/25/2016 7:40 AM Staffing Anesthesiologist: Rica Koyanagi Performed: anesthesiologist  Preanesthetic Checklist Completed: patient identified, site marked, surgical consent, pre-op evaluation, timeout performed, IV checked, risks and benefits discussed and monitors and equipment checked Spinal Block Patient position: sitting Prep: Betadine Patient monitoring: heart rate, cardiac monitor, continuous pulse ox and blood pressure Approach: midline Location: L3-4 Needle Needle type: Pencan  Needle gauge: 24 G Needle length: 9 cm Needle insertion depth: 3 cm Assessment Sensory level: T6

## 2016-10-25 NOTE — Evaluation (Signed)
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 S/p L TKA 10/25/16. PMH significant for HTN and Pulmonary embolism   Clinical Impression  Pt is s/p TKA resulting in the deficits listed below (see PT Problem List). Pt is supervision for bed mobility, min guard for transfers and minA for ambulation of 150 ft with RW.  Pt will benefit from skilled PT to increase their independence and safety with mobility to allow discharge to the venue listed below.      Follow Up Recommendations Home health PT;Supervision/Assistance - 24 hour    Equipment Recommendations  Rolling walker with 5" wheels;3in1 (PT)    Recommendations for Other Services OT consult     Precautions / Restrictions Precautions Precautions: Knee Precaution Booklet Issued: Yes (comment) Precaution Comments: given therex sheet Required Braces or Orthoses: Knee Immobilizer - Left Knee Immobilizer - Left: On when out of bed or walking Restrictions Weight Bearing Restrictions: Yes LLE Weight Bearing: Weight bearing as tolerated      Mobility  Bed Mobility Overal bed mobility: Needs Assistance Bed Mobility: Supine to Sit     Supine to sit: Supervision     General bed mobility comments: pt able to use bedrails to bring herself to EoB and able to scoot hips to EoB to bring LE to the ground  Transfers Overall transfer level: Needs assistance Equipment used: Rolling walker (2 wheeled) Transfers: Sit to/from Stand Sit to Stand: Min guard            Ambulation/Gait Ambulation/Gait assistance: Min assist Ambulation Distance (Feet): 150 Feet Assistive device: Rolling walker (2 wheeled) Gait Pattern/deviations: Step-through pattern;Decreased step length - right;Decreased stance time - left;Decreased weight shift to left;Trunk flexed;Antalgic Gait  velocity: slowed Gait velocity interpretation: Below normal speed for age/gender General Gait Details: pt with slow, steady gait, no LoB, no buckling, vc for upright posture and anterior pelvic tilt     Balance Overall balance assessment: Needs assistance Sitting-balance support: Feet supported;No upper extremity supported Sitting balance-Leahy Scale: Good     Standing balance support: Bilateral upper extremity supported Standing balance-Leahy Scale: Fair Standing balance comment: requires RW support for static standing                             Pertinent Vitals/Pain Pain Assessment: 0-10 Pain Score: 1  Pain Location: R leg Pain Descriptors / Indicators: Aching Pain Intervention(s): Monitored during session;Limited activity within patient's tolerance  VSS    Home Living Family/patient expects to be discharged to:: Private residence Living Arrangements: Spouse/significant other Available Help at Discharge: Family;Available 24 hours/day Type of Home: House Home Access: Stairs to enter Entrance Stairs-Rails: None Entrance Stairs-Number of Steps: 3 Home Layout: Two level;Able to live on main level with bedroom/bathroom Home Equipment: Hand held shower head      Prior Function Level of Independence: Independent         Comments: community ambulator and driver        Extremity/Trunk Assessment   Upper Extremity Assessment Upper Extremity Assessment: Defer to OT evaluation    Lower Extremity Assessment Lower Extremity Assessment: LLE deficits/detail LLE Deficits / Details: L knee and hip ROM and strength limited by pain LLE: Unable to fully assess due to pain    Cervical / Trunk Assessment Cervical / Trunk  Assessment: Normal  Communication   Communication: No difficulties  Cognition Arousal/Alertness: Awake/alert Behavior During Therapy: WFL for tasks assessed/performed Overall Cognitive Status: Within Functional Limits for tasks assessed                                            Exercises Total Joint Exercises Ankle Circles/Pumps: AROM;Both;10 reps;Seated Quad Sets: AROM;Left;10 reps;Seated Gluteal Sets: AROM;Both;10 reps;Supine Long Arc Quad: AROM;Left;10 reps;Seated Knee Flexion: AROM;Left;10 reps;Seated   Assessment/Plan    PT Assessment Patient needs continued PT services  PT Problem List Decreased strength;Decreased range of motion;Decreased activity tolerance;Decreased mobility;Decreased knowledge of use of DME;Pain       PT Treatment Interventions DME instruction;Gait training;Stair training;Functional mobility training;Therapeutic activities;Therapeutic exercise;Patient/family education    PT Goals (Current goals can be found in the Care Plan section)  Acute Rehab PT Goals Patient Stated Goal: go home Sunday PT Goal Formulation: With patient Potential to Achieve Goals: Good    Frequency 7X/week    End of Session Equipment Utilized During Treatment: Gait belt;Left knee immobilizer Activity Tolerance: Patient tolerated treatment well Patient left: in bed;with call bell/phone within reach Nurse Communication: Mobility status PT Visit Diagnosis: Other abnormalities of gait and mobility (R26.89);Pain Pain - Right/Left: Left Pain - part of body: Knee    Time: 6333-5456 PT Time Calculation (min) (ACUTE ONLY): 47 min   Charges:     PT Treatments $Gait Training: 8-22 mins $Therapeutic Exercise: 8-22 mins   PT G Codes:        Vandana Haman B. Migdalia Dk PT, DPT Acute Rehabilitation  [phone] Pager [phone]    [place] 10/25/2016, 3:42 PM

## 2016-10-25 NOTE — Transfer of Care (Signed)
Immediate Anesthesia Transfer of Care Note  Patient: April Hernandez  Procedure(s) Performed: Procedure(s): TOTAL KNEE ARTHROPLASTY (Left)  Patient Location: PACU  Anesthesia Type:MAC and Spinal  Level of Consciousness: awake, oriented and patient cooperative  Airway & Oxygen Therapy: Patient Spontanous Breathing and Patient connected to nasal cannula oxygen  Post-op Assessment: Report given to RN and Post -op Vital signs reviewed and stable  Post vital signs: Reviewed  Last Vitals:  Vitals:   10/25/16 0600  BP: (!) 167/72  Pulse: 73  Resp: 18  Temp: 36.6 C    Last Pain:  Vitals:   10/25/16 0600  TempSrc: Oral         Complications: No apparent anesthesia complications

## 2016-10-25 NOTE — Progress Notes (Signed)
Orthopedic Tech Progress Note Patient Details:  April Hernandez 05-Nov-1940 [id number]  CPM Left Knee CPM Left Knee: On Left Knee Flexion (Degrees): 70 Left Knee Extension (Degrees): 0 Additional Comments: Place pt on cpm at 0-70 degrees.  Left knee.   Kristopher Oppenheim 10/25/2016, 7:17 PM

## 2016-10-25 NOTE — Anesthesia Postprocedure Evaluation (Addendum)
Anesthesia Post Note  Patient: April Hernandez  Procedure(s) Performed: Procedure(s) (LRB): TOTAL KNEE ARTHROPLASTY (Left)  Patient location during evaluation: PACU Anesthesia Type: Spinal Level of consciousness: oriented and awake and alert Pain management: pain level controlled Vital Signs Assessment: post-procedure vital signs reviewed and stable Respiratory status: spontaneous breathing, respiratory function stable and patient connected to nasal cannula oxygen Cardiovascular status: blood pressure returned to baseline and stable Postop Assessment: no headache and no backache Anesthetic complications: no       Last Vitals:  Vitals:   10/25/16 1015 10/25/16 1030  BP: 126/60 134/71  Pulse: 73 65  Resp: (!) 22 13  Temp:      Last Pain:  Vitals:   10/25/16 0600  TempSrc: Oral                 Keshona Kartes,JAMES TERRILL

## 2016-10-25 NOTE — Op Note (Signed)
NAMEBHAKTI, LABELLA NO.:  [id number]  MEDICAL RECORD NO.:  [id number]  LOCATION:  [facility]                         FACILITY:  [facility]  PHYSICIAN:  Alta Corning, M.D.   DATE OF BIRTH:  [birth year]  DATE OF PROCEDURE:  10/25/2016 DATE OF DISCHARGE:                              OPERATIVE REPORT   PREOPERATIVE DIAGNOSIS:  End-stage degenerative joint disease, left knee.  POSTOPERATIVE DIAGNOSIS:  End-stage degenerative joint disease, left knee.  PROCEDURES PERFORMED: 1. Left total knee replacement with Attune system size 3 femur, size 3     tibia, 5 mm bridging bearing, and 32 mm All-Polyethylene patella. 2. Lateral retinacular release.  SURGEON:  Alta Corning, M.D.  Terrence DupontModena Slater.  ANESTHESIA:  Spinal.  BRIEF HISTORY:  Ms. Shreffler is a 76 year old female with long history of complaints of left knee pain.  She had been treated conservatively for prolonged period of time.  After failure of all conservative care, she was taken to the operating room for left total knee replacement.  X-ray showed bone-on-bone change and she was having night pain and light activity pain.  She had failed injection therapy, activity modification, and viscous supplementation.  DESCRIPTION OF PROCEDURE:  The patient was taken to the operating room. After adequate anesthesia was obtained with a spinal anesthetic, the patient was placed on the operating table with the left leg prepped and draped in usual sterile fashion.  Following this, the leg was exsanguinated.  Blood pressure tourniquet was inflated to 300 mmHg. Following this, a midline incision was made in subcutaneous tissue down the extensor mechanism.  A medial parapatellar arthrotomy was undertaken.  Following this, attention was turned towards the knee where the synovium on the anterior aspect of the femur, retropatellar fat pad, medial and lateral meniscus, and anterior and posterior cruciates were excised.   Attention was then turned to the femur where an intramedullary pilot hole was drilled.  A 6 degree valgus inclination was used for a distal femoral cut, 9 mm was taken.  Anterior and posterior cuts were made, chamfers and box.  After sizing it to a 3, attention was then turned to the tibia, it is cut perpendicular to its long axis and then it is drilled and keeled and sized to a 3.  The trial components were put in place.  Attention was turned to the patella, cut down to a level of 13 mm and a 32 paddle was chosen and lugs were drilled for the patella.  The patellar trial was placed.  Knee put through a range of motion.  Excellent stability was achieved with a little bit of slight lateral tracking of the patella.  At this point, we did a lateral retinacular release to allow [place] to have better tracking of the patella. An excellent range of motion tracking was achieved at this point.  Once this was done, attention was turned towards removal of all the trial components.  The knee was copiously and thoroughly irrigated and then dried, and then the final components were cemented into place.  Size 3 tibia, size 3 femur, a 5 mm bridging bearing trial were placed and a 32 All-Poly patella was placed  and held with a clamp.  The knee was then held in a single position till the cement was completely hardened, and once that was completed, the excess bone cement was removed.  Exparel with Marcaine and saline was injected throughout the synovial reflection to provide for postoperative pain control.  The tourniquet was let down once the cement was completely hardened.  We then looked in the back, irrigated the knee again.  Did put some topical tranexamic acid and let it sit for 5 minutes and then following this, put the final poly in place.  I closed the parapatellar arthrotomy with 1 Vicryl running, the skin with 0 Vicryl and 2-0 Vicryl, and 3-0 Monocryl subcuticular. Benzoin and Steri-Strips were  applied.  Sterile compressive dressing was applied.  The patient was taken to the recovery room and was noted to be in satisfactory condition.  Estimated blood loss for procedure is minimal.     Alta Corning, M.D.     Corliss Skains  D:  10/25/2016  T:  10/25/2016  Job:  [id number]

## 2016-10-25 NOTE — Anesthesia Procedure Notes (Signed)
Procedure Name: MAC Date/Time: 10/25/2016 7:45 AM Performed by: Jenne Campus Pre-anesthesia Checklist: Patient identified, Emergency Drugs available, Patient being monitored, Timeout performed and Suction available Oxygen Delivery Method: Simple face mask Intubation Type: Combination inhalational/ intravenous induction

## 2016-10-25 NOTE — Anesthesia Procedure Notes (Addendum)
Anesthesia Regional Block: Adductor canal block   Pre-Anesthetic Checklist: ,, timeout performed, Correct Patient, Correct Site, Correct Laterality, Correct Procedure, Correct Position, site marked, Risks and benefits discussed,  Surgical consent,  Pre-op evaluation,  At surgeon's request and post-op pain management  Laterality: Left and Lower  Prep: chloraprep       Needles:   Needle Type: Echogenic Stimulator Needle     Needle Length: 9cm  Needle Gauge: 21   Needle insertion depth: 5 cm   Additional Needles:   Procedures: ultrasound guided,,,,,,,,  Narrative:  Start time: 10/25/2016 7:10 AM End time: 10/25/2016 7:23 AM Injection made incrementally with aspirations every 5 mL.  Performed by: Personally  Anesthesiologist: Jasmin Winberry

## 2016-10-25 NOTE — Brief Op Note (Signed)
10/25/2016  9:30 AM  PATIENT:  April Hernandez  76 y.o. female  PRE-OPERATIVE DIAGNOSIS:  LEFT KNEE OSTEOARTHRITIS   POST-OPERATIVE DIAGNOSIS:  LEFT KNEE OSTEOARTHRITIS   PROCEDURE:  Procedure(s): TOTAL KNEE ARTHROPLASTY (Left)  SURGEON:  Surgeon(s) and Role:    * Dorna Leitz, MD - Primary  PHYSICIAN ASSISTANT:   ASSISTANTS: bethune   ANESTHESIA:   spinal  EBL:  Total I/O In: -  Out: 550 [Urine:500; Blood:50]  BLOOD ADMINISTERED:none  DRAINS: none   LOCAL MEDICATIONS USED:  MARCAINE    and OTHER experel  SPECIMEN:  No Specimen  DISPOSITION OF SPECIMEN:  N/A  COUNTS:  YES  TOURNIQUET:  * Missing tourniquet times found for documented tourniquets in log:  [id number] *  DICTATION: .Other Dictation: Dictation Number [id number]  PLAN OF CARE: Admit to inpatient   PATIENT DISPOSITION:  PACU - hemodynamically stable.   Delay start of Pharmacological VTE agent (>24hrs) due to surgical blood loss or risk of bleeding: no

## 2016-10-25 NOTE — Anesthesia Preprocedure Evaluation (Addendum)
Anesthesia Evaluation  Patient identified by MRN, date of birth, ID band Patient awake    Reviewed: Allergy & Precautions, NPO status , Patient's Chart, lab work & pertinent test results  Airway Mallampati: I  TM Distance: >3 FB Neck ROM: Full    Dental no notable dental hx. (+) Teeth Intact, Dental Advisory Given   Pulmonary  Hx of Pulm emboli   breath sounds clear to auscultation       Cardiovascular hypertension, Pt. on medications  Rhythm:Regular Rate:Normal     Neuro/Psych negative psych ROS   GI/Hepatic negative GI ROS, Neg liver ROS,   Endo/Other  negative endocrine ROS  Renal/GU negative Renal ROS  negative genitourinary   Musculoskeletal   Abdominal   Peds negative pediatric ROS (+)  Hematology  (+) Blood dyscrasia, , Factor 5 Leiden   Anesthesia Other Findings   Reproductive/Obstetrics                           Anesthesia Physical Anesthesia Plan  ASA: II  Anesthesia Plan: Spinal   Post-op Pain Management:    Induction: Intravenous  Airway Management Planned: Natural Airway  Additional Equipment:   Intra-op Plan:   Post-operative Plan: Extubation in OR  Informed Consent: I have reviewed the patients History and Physical, chart, labs and discussed the procedure including the risks, benefits and alternatives for the proposed anesthesia with the patient or authorized representative who has indicated his/her understanding and acceptance.   Dental advisory given  Plan Discussed with: CRNA  Anesthesia Plan Comments:         Anesthesia Quick Evaluation

## 2016-10-25 NOTE — Care Management Note (Addendum)
Case Management Note  Patient Details  Name: April Hernandez MRN: [id number] Date of Birth: May 26, 1941  Subjective/Objective:   76 yr old female s/p left total knee arthroplasty.                  Action/Plan: Case manager spoke with patient concerning discharge plan and Home health needs. Patient was preoperatively setup with [facility], no changes. [facility] will deliver RW and 3in1. CPM will be delivered to patient's home. She will have family support at discharge.    Expected Discharge Date:    10/26/16              Expected Discharge Plan:   Home with Home Health  In-House Referral:     Discharge planning Services  CM Consult  Post Acute Care Choice:  Durable Medical Equipment, Home Health Choice offered to:  Patient  DME Arranged:  3-N-1, Walker rolling, CPM DME Agency:  [facility] [employer]  HH Arranged:  PT HH Agency:  [facility] (formerly [employer])  Status of Service:  In process, will continue to follow  If discussed at Long Length of Stay Meetings, dates discussed:    Additional Comments:  Ninfa Meeker, RN 10/25/2016, 2:43 PM

## 2016-10-26 LAB — BASIC METABOLIC PANEL
ANION GAP: 7 (ref 5–15)
BUN: 16 mg/dL (ref 6–20)
CALCIUM: 8.7 mg/dL — AB (ref 8.9–10.3)
CO2: 25 mmol/L (ref 22–32)
Chloride: 103 mmol/L (ref 101–111)
Creatinine, Ser: 0.67 mg/dL (ref 0.44–1.00)
GFR calc Af Amer: >60 mL/min (ref >60)
GLUCOSE: 199 mg/dL — AB (ref 65–99)
POTASSIUM: 3.8 mmol/L (ref 3.5–5.1)
SODIUM: 135 mmol/L (ref 135–145)

## 2016-10-26 LAB — CBC
HCT: 32.9 % — ABNORMAL LOW (ref 36.0–46.0)
Hemoglobin: 10.7 g/dL — ABNORMAL LOW (ref 12.0–15.0)
MCH: 27.4 pg (ref 26.0–34.0)
MCHC: 32.5 g/dL (ref 30.0–36.0)
MCV: 84.1 fL (ref 78.0–100.0)
PLATELETS: 169 10*3/uL (ref 150–400)
RBC: 3.91 MIL/uL (ref 3.87–5.11)
RDW: 13.2 % (ref 11.5–15.5)
WBC: 11.8 10*3/uL — ABNORMAL HIGH (ref 4.0–10.5)

## 2016-10-26 NOTE — Progress Notes (Signed)
PATIENT ID: April Hernandez  MRN: [id number]  DOB/AGE:  1940/08/26 / 76 y.o.  1 Day Post-Op Procedure(s) (LRB): TOTAL KNEE ARTHROPLASTY (Left)    PROGRESS NOTE Subjective: Patient is alert, oriented, no Nausea, no Vomiting, yes passing gas. Taking PO well. Denies SOB, Chest or Calf Pain. Using Incentive Spirometer, PAS in place. Ambulate WBAT with pt walking 150 ft with therapy, Patient reports pain as 4/10 .    Objective: Vital signs in last 24 hours: Vitals:   10/25/16 1705 10/25/16 2044 10/26/16 0057 10/26/16 0550  BP: 137/79 132/69 121/62 121/71  Pulse: 76 72 69 64  Resp: 16     Temp: 97.5 F (36.4 C) 99.1 F (37.3 C) 98.4 F (36.9 C) 97.9 F (36.6 C)  TempSrc: Oral Oral Oral Oral  SpO2: 96% 95% 95% 96%  Weight:      Height:          Intake/Output from previous day: I/O last 3 completed shifts: In: 2880 [P.O.:480; I.V.:2400] Out: 2620 [Urine:1700; Blood:50]   Intake/Output this shift: No intake/output data recorded.   LABORATORY DATA:  Recent Labs  10/25/16 0618 10/26/16 0529  WBC  --  11.8*  HGB  --  10.7*  HCT  --  32.9*  PLT  --  169  NA  --  135  K  --  3.8  CL  --  103  CO2  --  25  BUN  --  16  CREATININE  --  0.67  GLUCOSE  --  199*  INR 1.00  --   CALCIUM  --  8.7*    Examination: Neurologically intact Neurovascular intact Sensation intact distally Intact pulses distally Dorsiflexion/Plantar flexion intact Incision: dressing C/D/I No cellulitis present Compartment soft}  Assessment:   1 Day Post-Op Procedure(s) (LRB): TOTAL KNEE ARTHROPLASTY (Left) ADDITIONAL DIAGNOSIS: Expected Acute Blood Loss Anemia, Hypertension and hx of PE and factor 5 leiden  Plan: PT/OT WBAT, AROM and PROM  DVT Prophylaxis:  SCDx72hrs, home dose of Xarelto to start tomorrow. DISCHARGE PLAN: Home possibly later today. DISCHARGE NEEDS: HHPT, CPM, Walker and 3-in-1 comode seat     PHILLIPS, ERIC R 10/26/2016, 8:38 AM

## 2016-10-26 NOTE — Progress Notes (Signed)
Physical Therapy Treatment Patient Details Name: April Hernandez MRN: [id number] DOB: 06-24-1941 Today's Date: 10/26/2016    History of Present Illness Pt is a 76 yo female with left knee pain secondary to end stage DJD. S/p L TKA 10/25/16. PMH significant for HTN and Pulmonary embolism     PT Comments    Excellent progress with mobility. Pt ambulated this session without KI. Plan is for d/c home tomorrow.   Follow Up Recommendations  Home health PT;Supervision/Assistance - 24 hour     Equipment Recommendations  Rolling walker with 5" wheels;3in1 (PT)    Recommendations for Other Services       Precautions / Restrictions Precautions Precautions: Knee Restrictions LLE Weight Bearing: Weight bearing as tolerated    Mobility  Bed Mobility Overal bed mobility: Modified Independent                Transfers   Equipment used: Rolling walker (2 wheeled)   Sit to Stand: Supervision Stand pivot transfers: Supervision       General transfer comment: Pt demo safe technique.  Ambulation/Gait Ambulation/Gait assistance: Supervision Ambulation Distance (Feet): 700 Feet Assistive device: Rolling walker (2 wheeled) Gait Pattern/deviations: Step-through pattern;Decreased stance time - left;Decreased weight shift to left Gait velocity: WFL   General Gait Details: Steady gait. Pt ambulated without KI.   Stairs            Wheelchair Mobility    Modified Rankin (Stroke Patients Only)       Balance                                            Cognition Arousal/Alertness: Awake/alert Behavior During Therapy: WFL for tasks assessed/performed Overall Cognitive Status: Within Functional Limits for tasks assessed                                        Exercises Total Joint Exercises Ankle Circles/Pumps: AROM;Both;10 reps;Supine Quad Sets: AROM;Left;10 reps;Supine Heel Slides: AROM;Left;10 reps;Supine Hip ABduction/ADduction:  AROM;Left;10 reps;Supine Goniometric ROM: 0-95 L knee    General Comments        Pertinent Vitals/Pain Pain Assessment: 0-10 Pain Score: 2  Pain Location: L knee Pain Descriptors / Indicators: Tightness Pain Intervention(s): Monitored during session    Home Living                      Prior Function            PT Goals (current goals can now be found in the care plan section) Acute Rehab PT Goals Patient Stated Goal: independence PT Goal Formulation: With patient Potential to Achieve Goals: Good Progress towards PT goals: Progressing toward goals    Frequency    7X/week      PT Plan Current plan remains appropriate    Co-evaluation             End of Session Equipment Utilized During Treatment: Gait belt Activity Tolerance: Patient tolerated treatment well Patient left: in bed;with call bell/phone within reach Nurse Communication: Mobility status PT Visit Diagnosis: Other abnormalities of gait and mobility (R26.89);Pain Pain - Right/Left: Left Pain - part of body: Knee     Time: 7673-4193 PT Time Calculation (min) (ACUTE ONLY): 27 min  Charges:  $Gait Training: 8-22 mins $  Therapeutic Exercise: 8-22 mins                    G Codes:       Lorrin Goodell, PT  Office # [phone] Pager [phone]    Lorriane Shire 10/26/2016, 5:22 PM

## 2016-10-26 NOTE — Progress Notes (Signed)
Physical Therapy Treatment Patient Details Name: April Hernandez MRN: [id number] DOB: 01/24/1941 Today's Date: 10/26/2016    History of Present Illness Pt is a 76 yo female with left knee pain secondary to end stage DJD. S/p L TKA 10/25/16. PMH significant for HTN and Pulmonary embolism     PT Comments    Pt doing well. Stair training complete.   Follow Up Recommendations  Home health PT;Supervision/Assistance - 24 hour     Equipment Recommendations  Rolling walker with 5" wheels;3in1 (PT)    Recommendations for Other Services OT consult     Precautions / Restrictions Precautions Precautions: Knee Required Braces or Orthoses: Knee Immobilizer - Left Knee Immobilizer - Left: On when out of bed or walking Restrictions Weight Bearing Restrictions: Yes LLE Weight Bearing: Weight bearing as tolerated    Mobility  Bed Mobility         Supine to sit: Supervision;HOB elevated     General bed mobility comments: Pt demo safe technique.  Transfers   Equipment used: Conservation officer, nature (2 wheeled) Transfers: Risk manager Sit to Stand: Supervision Stand pivot transfers: Supervision       General transfer comment: Pt demo safe technique.  Ambulation/Gait Ambulation/Gait assistance: Min guard Ambulation Distance (Feet): 350 Feet Assistive device: Rolling walker (2 wheeled) Gait Pattern/deviations: Step-through pattern;Decreased weight shift to left Gait velocity: WFL Gait velocity interpretation: Below normal speed for age/gender General Gait Details: Steady gait   Stairs Stairs: Yes   Stair Management: No rails;Forwards;Step to pattern Number of Stairs: 3 General stair comments: +1 HHA on L, home steps wide enough for husband to walk up beside her to assist.  Wheelchair Mobility    Modified Rankin (Stroke Patients Only)       Balance   Sitting-balance support: No upper extremity supported;Feet supported Sitting balance-Leahy Scale: Good      Standing balance support: Bilateral upper extremity supported;During functional activity Standing balance-Leahy Scale: Fair                              Cognition Arousal/Alertness: Awake/alert Behavior During Therapy: WFL for tasks assessed/performed Overall Cognitive Status: Within Functional Limits for tasks assessed                                        Exercises      General Comments        Pertinent Vitals/Pain Pain Assessment: 0-10 Pain Score: 2  Pain Location: sx site Pain Descriptors / Indicators: Tightness Pain Intervention(s): Monitored during session;Repositioned    Home Living                      Prior Function            PT Goals (current goals can now be found in the care plan section) Acute Rehab PT Goals Patient Stated Goal: independence PT Goal Formulation: With patient Potential to Achieve Goals: Good Progress towards PT goals: Progressing toward goals    Frequency    7X/week      PT Plan Current plan remains appropriate    Co-evaluation             End of Session Equipment Utilized During Treatment: Gait belt;Left knee immobilizer Activity Tolerance: Patient tolerated treatment well Patient left: in chair;with call bell/phone within reach Nurse Communication: Mobility status PT  Visit Diagnosis: Other abnormalities of gait and mobility (R26.89);Pain Pain - Right/Left: Left Pain - part of body: Knee     Time: 4239-5320 PT Time Calculation (min) (ACUTE ONLY): 31 min  Charges:  $Gait Training: 23-37 mins                    G Codes:       Lorrin Goodell, PT  Office # [phone] Pager [phone]    Lorriane Shire 10/26/2016, 11:50 AM

## 2016-10-27 LAB — CBC
HCT: 32.8 % — ABNORMAL LOW (ref 36.0–46.0)
Hemoglobin: 10.9 g/dL — ABNORMAL LOW (ref 12.0–15.0)
MCH: 28.5 pg (ref 26.0–34.0)
MCHC: 33.2 g/dL (ref 30.0–36.0)
MCV: 85.6 fL (ref 78.0–100.0)
Platelets: 162 K/uL (ref 150–400)
RBC: 3.83 MIL/uL — ABNORMAL LOW (ref 3.87–5.11)
RDW: 13.9 % (ref 11.5–15.5)
WBC: 8.7 K/uL (ref 4.0–10.5)

## 2016-10-27 NOTE — Progress Notes (Signed)
Cm has notified [facility] who is waiting for face to face and HHPT orders from MD to process referral.

## 2016-10-27 NOTE — Discharge Summary (Signed)
Patient ID: April Hernandez MRN: [id number] DOB/AGE: 02/11/1941 76 y.o.  Admit date: 10/25/2016 Discharge date: 10/27/2016  Admission Diagnoses:  Principal Problem:   Primary osteoarthritis of left knee   Discharge Diagnoses:  Same  Past Medical History:  Diagnosis Date  . Arthritis    "hands, fingers, left knee to toes" (10/25/2016)  . Factor 5 Leiden mutation, heterozygous ([place])   . History of kidney stones   . Hypertension   . Melanoma ([place])    "behind ear"  . Pulmonary embolism ([place]) 11/2010    Surgeries: Procedure(s): TOTAL KNEE ARTHROPLASTY on 10/25/2016   Consultants:   Discharged Condition: Improved  Hospital Course: April Hernandez is an 76 y.o. female who was admitted 10/25/2016 for operative treatment ofPrimary osteoarthritis of left knee. Patient has severe unremitting pain that affects sleep, daily activities, and work/hobbies. After pre-op clearance the patient was taken to the operating room on 10/25/2016 and underwent  Procedure(s): TOTAL KNEE ARTHROPLASTY.    Patient was given perioperative antibiotics: Anti-infectives    Start     Dose/Rate Route Frequency Ordered Stop   10/25/16 1900  vancomycin (VANCOCIN) IVPB 1000 mg/200 mL premix     1,000 mg 200 mL/hr over 60 Minutes Intravenous Every 12 hours 10/25/16 1018 10/26/16 1859   10/25/16 0545  vancomycin (VANCOCIN) 1-5 GM/200ML-% IVPB    Comments:  Rosenberger, Meredit: cabinet override      10/25/16 0545 10/25/16 0715   10/25/16 0540  vancomycin (VANCOCIN) IVPB 1000 mg/200 mL premix     1,000 mg 200 mL/hr over 60 Minutes Intravenous On call to O.R. 10/25/16 0540 10/25/16 0815       Patient was given sequential compression devices, early ambulation, and chemoprophylaxis to prevent DVT.  Patient benefited maximally from hospital stay and there were no complications.    Recent vital signs: Patient Vitals for the past 24 hrs:  BP Temp Temp src Pulse Resp SpO2  10/27/16 0540 (!) 144/74 98.4 F (36.9 C)  Oral 75 18 96 %  10/26/16 2009 (!) 141/69 98.2 F (36.8 C) Oral 70 18 97 %  10/26/16 1655 119/76 98.4 F (36.9 C) Oral 76 16 95 %     Recent laboratory studies:  Recent Labs  10/25/16 0618 10/26/16 0529  WBC  --  11.8*  HGB  --  10.7*  HCT  --  32.9*  PLT  --  169  NA  --  135  K  --  3.8  CL  --  103  CO2  --  25  BUN  --  16  CREATININE  --  0.67  GLUCOSE  --  199*  INR 1.00  --   CALCIUM  --  8.7*     Discharge Medications:   Allergies as of 10/27/2016      Reactions   Lobster [shellfish Allergy] Anaphylaxis, Rash   Eliquis [apixaban]    Red spots on arms and legs, rash   Alendronate Rash      Medication List    TAKE these medications   amLODipine 5 MG tablet Commonly known as:  NORVASC Take 5 mg by mouth daily.   CALCIUM/VITAMIN D PO Take 1 tablet by mouth every evening.   CENTRUM SILVER PO Take 1 tablet by mouth daily.   Co Q 10 100 MG Caps Take 100 mg by mouth daily. With vitamin c   docusate sodium 100 MG capsule Commonly known as:  COLACE Take 1 capsule (100 mg total) by mouth 2 (two)  times daily.   Omega-3 Fish Oil 300 MG Caps Take 600 mg by mouth 2 (two) times daily.   OVER THE COUNTER MEDICATION Take 2 tablets by mouth daily. Ultimate eye support otc multivitamin   OVER THE COUNTER MEDICATION Take 2 tablets by mouth daily. Ultimate HA hyaluronic acid   oxyCODONE-acetaminophen 5-325 MG tablet Commonly known as:  PERCOCET/ROXICET Take 1-2 tablets by mouth every 6 (six) hours as needed for severe pain.   rivaroxaban 20 MG Tabs tablet Commonly known as:  XARELTO Take 20 mg by mouth daily with supper. Will stop 10-21-16   SLOW RELEASE IRON 45 MG Tbcr Generic drug:  Ferrous Sulfate Dried Take 45 mg by mouth daily.   tiZANidine 2 MG tablet Commonly known as:  ZANAFLEX Take 1 tablet (2 mg total) by mouth every 8 (eight) hours as needed for muscle spasms.   Vitamin D3 2000 units capsule Take 2,000 Units by mouth daily.        Diagnostic Studies: Dg Chest Port 1 View  Result Date: 10/25/2016 CLINICAL DATA:  Preop total knee arthroplasty EXAM: PORTABLE CHEST 1 VIEW COMPARISON:  CT chest dated 01/06/2014 FINDINGS: Lungs are clear.  No pleural effusion or pneumothorax. The heart is normal in size. IMPRESSION: No evidence of acute cardiopulmonary disease. Electronically Signed   By: Julian Hy M.D.   On: 10/25/2016 07:50    Disposition: 01-Home or Self Care  Discharge Instructions    CPM    Complete by:  As directed    Continuous passive motion machine (CPM):      Use the CPM from 0 to 60  for 5 hours per day.      You may increase by 10 degrees per day.  You may break it up into 2 or 3 sessions per day.      Use CPM for 2 weeks or until you are told to stop.   Call MD / Call 911    Complete by:  As directed    If you experience chest pain or shortness of breath, CALL 911 and be transported to the hospital emergency room.  If you develope a fever above 101 F, pus (white drainage) or increased drainage or redness at the wound, or calf pain, call your surgeon's office.   Constipation Prevention    Complete by:  As directed    Drink plenty of fluids.  Prune juice may be helpful.  You may use a stool softener, such as Colace (over the counter) 100 mg twice a day.  Use MiraLax (over the counter) for constipation as needed.   Diet - low sodium heart healthy    Complete by:  As directed    Driving restrictions    Complete by:  As directed    No driving for 2 weeks   Increase activity slowly as tolerated    Complete by:  As directed    Patient may shower    Complete by:  As directed    You may shower without a dressing once there is no drainage.  Do not wash over the wound.  If drainage remains, cover wound with plastic wrap and then shower.      Follow-up Information    April L, MD. Schedule an appointment as soon as possible for a visit in 2 week(s).   Specialty:  Orthopedic Surgery Contact  information: [place] [address] [place] [facility].   Specialty:  Home Health Services Why:  A representative from [facility] will contact you to arrange start date date and time for your therapy. Contact information: [address] [place] [address] [phone]            Signed: Hardin Negus, Corliss Lamartina R 10/27/2016, 8:43 AM

## 2016-10-27 NOTE — Progress Notes (Signed)
Patient to be discharged to home. IV removed. Discharge instructions and prescriptions reviewed with patient. Patient stated understanding. Patient has family at the bedside for transportation

## 2016-10-27 NOTE — Progress Notes (Signed)
Physical Therapy Treatment Patient Details Name: April Hernandez MRN: [id number] DOB: January 31, 1941 Today's Date: 10/27/2016    History of Present Illness Pt is a 76 yo female with left knee pain secondary to end stage DJD. S/p L TKA 10/25/16. PMH significant for HTN and Pulmonary embolism     PT Comments    All education complete and goals met. Pt to d/c home today. PT signing off.   Follow Up Recommendations  Home health PT;Supervision for mobility/OOB     Equipment Recommendations  Rolling walker with 5" wheels;3in1 (PT)    Recommendations for Other Services       Precautions / Restrictions Precautions Precautions: Knee Precaution Comments: reviewed no pillow under knee Knee Immobilizer - Left: Other (comment) (ok to d/c per PA. Pt able to do SLR without knee lag.) Restrictions LLE Weight Bearing: Weight bearing as tolerated    Mobility  Bed Mobility Overal bed mobility: Modified Independent                Transfers Overall transfer level: Modified independent                  Ambulation/Gait Ambulation/Gait assistance: Modified independent (Device/Increase time) Ambulation Distance (Feet): 700 Feet Assistive device: Rolling walker (2 wheeled) Gait Pattern/deviations: Step-through pattern Gait velocity: WFL Gait velocity interpretation: at or above normal speed for age/gender General Gait Details: verbal cues for heel strike and toe off on left. Ambulated without KI.   Stairs            Wheelchair Mobility    Modified Rankin (Stroke Patients Only)       Balance   Sitting-balance support: Feet unsupported;No upper extremity supported Sitting balance-Leahy Scale: Normal     Standing balance support: During functional activity;Bilateral upper extremity supported Standing balance-Leahy Scale: Good                              Cognition Arousal/Alertness: Awake/alert Behavior During Therapy: WFL for tasks  assessed/performed Overall Cognitive Status: Within Functional Limits for tasks assessed                                        Exercises Total Joint Exercises Ankle Circles/Pumps: AROM;Both;20 reps Quad Sets: AROM;Left;10 reps Long Arc Quad: AROM;Left;10 reps Goniometric ROM: 0-95 L knee    General Comments        Pertinent Vitals/Pain Pain Assessment: 0-10 Pain Score: 1  Pain Location: L knee Pain Descriptors / Indicators: Tightness Pain Intervention(s): Monitored during session    Home Living                      Prior Function            PT Goals (current goals can now be found in the care plan section) Acute Rehab PT Goals Patient Stated Goal: independence PT Goal Formulation: With patient Time For Goal Achievement: 11/01/16 Potential to Achieve Goals: Good Progress towards PT goals: Goals met/education completed, patient discharged from PT    Frequency    7X/week      PT Plan Current plan remains appropriate    Co-evaluation             End of Session   Activity Tolerance: Patient tolerated treatment well Patient left: in bed;with call bell/phone within reach;with nursing/sitter in room Nurse  Communication: Mobility status PT Visit Diagnosis: Other abnormalities of gait and mobility (R26.89);Pain Pain - Right/Left: Left Pain - part of body: Knee     Time: 0836-0859 PT Time Calculation (min) (ACUTE ONLY): 23 min  Charges:  $Gait Training: 8-22 mins $Therapeutic Exercise: 8-22 mins                    G Codes:       Wendy Garrow, PT  Office # [phone] Pager #[phone]    Garrow, Wendy Rene 10/27/2016, 9:05 AM   

## 2016-10-27 NOTE — Progress Notes (Signed)
PATIENT ID: April Hernandez  MRN: [id number]  DOB/AGE:  [birth year] / 76 y.o.  2 Days Post-Op Procedure(s) (LRB): TOTAL KNEE ARTHROPLASTY (Left)    PROGRESS NOTE Subjective: Patient is alert, oriented, no Nausea, no Vomiting, yes passing gas. Taking PO well. Denies SOB, Chest or Calf Pain. Using Incentive Spirometer, PAS in place. Ambulate WBAT with pt walking 700 ft with therapy, Patient reports pain as stiffness .    Objective: Vital signs in last 24 hours: Vitals:   10/26/16 0550 10/26/16 1655 10/26/16 2009 10/27/16 0540  BP: 121/71 119/76 (!) 141/69 (!) 144/74  Pulse: 64 76 70 75  Resp:  16 18 18   Temp: 97.9 F (36.6 C) 98.4 F (36.9 C) 98.2 F (36.8 C) 98.4 F (36.9 C)  TempSrc: Oral Oral Oral Oral  SpO2: 96% 95% 97% 96%  Weight:      Height:          Intake/Output from previous day: I/O last 3 completed shifts: In: 720 [P.O.:720] Out: 1200 [Urine:1200]   Intake/Output this shift: No intake/output data recorded.   LABORATORY DATA:  Recent Labs  10/25/16 0618 10/26/16 0529  WBC  --  11.8*  HGB  --  10.7*  HCT  --  32.9*  PLT  --  169  NA  --  135  K  --  3.8  CL  --  103  CO2  --  25  BUN  --  16  CREATININE  --  0.67  GLUCOSE  --  199*  INR 1.00  --   CALCIUM  --  8.7*    Examination: Neurologically intact Neurovascular intact Sensation intact distally Intact pulses distally Dorsiflexion/Plantar flexion intact Incision: dressing C/D/I No cellulitis present Compartment soft}  Assessment:   2 Days Post-Op Procedure(s) (LRB): TOTAL KNEE ARTHROPLASTY (Left) ADDITIONAL DIAGNOSIS: Expected Acute Blood Loss Anemia, Hypertension  Plan: PT/OT WBAT, AROM and PROM  DVT Prophylaxis:  [id number], Xarelto DISCHARGE PLAN: Home, today DISCHARGE NEEDS: HHPT, CPM, Walker and 3-in-1 comode seat     Marcele Kosta R 10/27/2016, 8:40 AM

## 2016-10-28 ENCOUNTER — Encounter (HOSPITAL_COMMUNITY): Payer: Self-pay | Admitting: Orthopedic Surgery

## 2016-10-29 DIAGNOSIS — I10 Essential (primary) hypertension: Secondary | ICD-10-CM | POA: Diagnosis not present

## 2016-10-29 DIAGNOSIS — Z86718 Personal history of other venous thrombosis and embolism: Secondary | ICD-10-CM | POA: Diagnosis not present

## 2016-10-29 DIAGNOSIS — M19041 Primary osteoarthritis, right hand: Secondary | ICD-10-CM | POA: Diagnosis not present

## 2016-10-29 DIAGNOSIS — Z471 Aftercare following joint replacement surgery: Secondary | ICD-10-CM | POA: Diagnosis not present

## 2016-10-29 DIAGNOSIS — Z96652 Presence of left artificial knee joint: Secondary | ICD-10-CM | POA: Diagnosis not present

## 2016-10-29 DIAGNOSIS — M19042 Primary osteoarthritis, left hand: Secondary | ICD-10-CM | POA: Diagnosis not present

## 2016-10-30 DIAGNOSIS — M19041 Primary osteoarthritis, right hand: Secondary | ICD-10-CM | POA: Diagnosis not present

## 2016-10-30 DIAGNOSIS — M19042 Primary osteoarthritis, left hand: Secondary | ICD-10-CM | POA: Diagnosis not present

## 2016-10-30 DIAGNOSIS — Z86718 Personal history of other venous thrombosis and embolism: Secondary | ICD-10-CM | POA: Diagnosis not present

## 2016-10-30 DIAGNOSIS — Z471 Aftercare following joint replacement surgery: Secondary | ICD-10-CM | POA: Diagnosis not present

## 2016-10-30 DIAGNOSIS — I10 Essential (primary) hypertension: Secondary | ICD-10-CM | POA: Diagnosis not present

## 2016-10-30 DIAGNOSIS — Z96652 Presence of left artificial knee joint: Secondary | ICD-10-CM | POA: Diagnosis not present

## 2016-11-04 DIAGNOSIS — Z471 Aftercare following joint replacement surgery: Secondary | ICD-10-CM | POA: Diagnosis not present

## 2016-11-04 DIAGNOSIS — Z86718 Personal history of other venous thrombosis and embolism: Secondary | ICD-10-CM | POA: Diagnosis not present

## 2016-11-04 DIAGNOSIS — M19041 Primary osteoarthritis, right hand: Secondary | ICD-10-CM | POA: Diagnosis not present

## 2016-11-04 DIAGNOSIS — M19042 Primary osteoarthritis, left hand: Secondary | ICD-10-CM | POA: Diagnosis not present

## 2016-11-04 DIAGNOSIS — Z96652 Presence of left artificial knee joint: Secondary | ICD-10-CM | POA: Diagnosis not present

## 2016-11-04 DIAGNOSIS — I10 Essential (primary) hypertension: Secondary | ICD-10-CM | POA: Diagnosis not present

## 2016-11-06 DIAGNOSIS — Z471 Aftercare following joint replacement surgery: Secondary | ICD-10-CM | POA: Diagnosis not present

## 2016-11-06 DIAGNOSIS — M19041 Primary osteoarthritis, right hand: Secondary | ICD-10-CM | POA: Diagnosis not present

## 2016-11-06 DIAGNOSIS — Z86718 Personal history of other venous thrombosis and embolism: Secondary | ICD-10-CM | POA: Diagnosis not present

## 2016-11-06 DIAGNOSIS — Z96652 Presence of left artificial knee joint: Secondary | ICD-10-CM | POA: Diagnosis not present

## 2016-11-06 DIAGNOSIS — M19042 Primary osteoarthritis, left hand: Secondary | ICD-10-CM | POA: Diagnosis not present

## 2016-11-06 DIAGNOSIS — I10 Essential (primary) hypertension: Secondary | ICD-10-CM | POA: Diagnosis not present

## 2016-11-11 DIAGNOSIS — M25662 Stiffness of left knee, not elsewhere classified: Secondary | ICD-10-CM | POA: Diagnosis not present

## 2016-11-11 DIAGNOSIS — M1712 Unilateral primary osteoarthritis, left knee: Secondary | ICD-10-CM | POA: Diagnosis not present

## 2016-11-11 DIAGNOSIS — Z96652 Presence of left artificial knee joint: Secondary | ICD-10-CM | POA: Diagnosis not present

## 2016-11-11 DIAGNOSIS — M25562 Pain in left knee: Secondary | ICD-10-CM | POA: Diagnosis not present

## 2016-11-15 DIAGNOSIS — M25562 Pain in left knee: Secondary | ICD-10-CM | POA: Diagnosis not present

## 2016-11-15 DIAGNOSIS — M25662 Stiffness of left knee, not elsewhere classified: Secondary | ICD-10-CM | POA: Diagnosis not present

## 2016-11-15 DIAGNOSIS — Z96652 Presence of left artificial knee joint: Secondary | ICD-10-CM | POA: Diagnosis not present

## 2016-11-18 DIAGNOSIS — M25662 Stiffness of left knee, not elsewhere classified: Secondary | ICD-10-CM | POA: Diagnosis not present

## 2016-11-18 DIAGNOSIS — M25562 Pain in left knee: Secondary | ICD-10-CM | POA: Diagnosis not present

## 2016-11-18 DIAGNOSIS — Z96652 Presence of left artificial knee joint: Secondary | ICD-10-CM | POA: Diagnosis not present

## 2016-11-22 DIAGNOSIS — M25662 Stiffness of left knee, not elsewhere classified: Secondary | ICD-10-CM | POA: Diagnosis not present

## 2016-11-22 DIAGNOSIS — Z96652 Presence of left artificial knee joint: Secondary | ICD-10-CM | POA: Diagnosis not present

## 2016-11-22 DIAGNOSIS — M25562 Pain in left knee: Secondary | ICD-10-CM | POA: Diagnosis not present

## 2016-11-25 DIAGNOSIS — M25662 Stiffness of left knee, not elsewhere classified: Secondary | ICD-10-CM | POA: Diagnosis not present

## 2016-11-25 DIAGNOSIS — Z9889 Other specified postprocedural states: Secondary | ICD-10-CM | POA: Diagnosis not present

## 2016-11-25 DIAGNOSIS — M25562 Pain in left knee: Secondary | ICD-10-CM | POA: Diagnosis not present

## 2016-11-25 DIAGNOSIS — Z96652 Presence of left artificial knee joint: Secondary | ICD-10-CM | POA: Diagnosis not present

## 2016-11-29 DIAGNOSIS — M25562 Pain in left knee: Secondary | ICD-10-CM | POA: Diagnosis not present

## 2016-11-29 DIAGNOSIS — M25662 Stiffness of left knee, not elsewhere classified: Secondary | ICD-10-CM | POA: Diagnosis not present

## 2016-11-29 DIAGNOSIS — Z96652 Presence of left artificial knee joint: Secondary | ICD-10-CM | POA: Diagnosis not present

## 2016-12-03 DIAGNOSIS — M25662 Stiffness of left knee, not elsewhere classified: Secondary | ICD-10-CM | POA: Diagnosis not present

## 2016-12-03 DIAGNOSIS — M25562 Pain in left knee: Secondary | ICD-10-CM | POA: Diagnosis not present

## 2016-12-03 DIAGNOSIS — Z96652 Presence of left artificial knee joint: Secondary | ICD-10-CM | POA: Diagnosis not present

## 2016-12-06 DIAGNOSIS — Z96652 Presence of left artificial knee joint: Secondary | ICD-10-CM | POA: Diagnosis not present

## 2016-12-06 DIAGNOSIS — M25662 Stiffness of left knee, not elsewhere classified: Secondary | ICD-10-CM | POA: Diagnosis not present

## 2016-12-06 DIAGNOSIS — M25562 Pain in left knee: Secondary | ICD-10-CM | POA: Diagnosis not present

## 2016-12-06 NOTE — Addendum Note (Signed)
Addendum  created 12/06/16 1406 by Rica Koyanagi, MD   Sign clinical note

## 2016-12-09 DIAGNOSIS — M25662 Stiffness of left knee, not elsewhere classified: Secondary | ICD-10-CM | POA: Diagnosis not present

## 2016-12-09 DIAGNOSIS — M25562 Pain in left knee: Secondary | ICD-10-CM | POA: Diagnosis not present

## 2016-12-09 DIAGNOSIS — Z96652 Presence of left artificial knee joint: Secondary | ICD-10-CM | POA: Diagnosis not present

## 2016-12-13 DIAGNOSIS — M25562 Pain in left knee: Secondary | ICD-10-CM | POA: Diagnosis not present

## 2016-12-13 DIAGNOSIS — M25662 Stiffness of left knee, not elsewhere classified: Secondary | ICD-10-CM | POA: Diagnosis not present

## 2016-12-13 DIAGNOSIS — Z96652 Presence of left artificial knee joint: Secondary | ICD-10-CM | POA: Diagnosis not present

## 2016-12-16 DIAGNOSIS — M25662 Stiffness of left knee, not elsewhere classified: Secondary | ICD-10-CM | POA: Diagnosis not present

## 2016-12-16 DIAGNOSIS — M25562 Pain in left knee: Secondary | ICD-10-CM | POA: Diagnosis not present

## 2016-12-16 DIAGNOSIS — Z96652 Presence of left artificial knee joint: Secondary | ICD-10-CM | POA: Diagnosis not present

## 2016-12-23 DIAGNOSIS — Z96652 Presence of left artificial knee joint: Secondary | ICD-10-CM | POA: Diagnosis not present

## 2016-12-23 DIAGNOSIS — M25562 Pain in left knee: Secondary | ICD-10-CM | POA: Diagnosis not present

## 2016-12-23 DIAGNOSIS — M25662 Stiffness of left knee, not elsewhere classified: Secondary | ICD-10-CM | POA: Diagnosis not present

## 2016-12-30 DIAGNOSIS — M25662 Stiffness of left knee, not elsewhere classified: Secondary | ICD-10-CM | POA: Diagnosis not present

## 2016-12-30 DIAGNOSIS — Z96652 Presence of left artificial knee joint: Secondary | ICD-10-CM | POA: Diagnosis not present

## 2016-12-30 DIAGNOSIS — M25562 Pain in left knee: Secondary | ICD-10-CM | POA: Diagnosis not present

## 2017-01-23 DIAGNOSIS — I10 Essential (primary) hypertension: Secondary | ICD-10-CM | POA: Diagnosis not present

## 2017-01-23 DIAGNOSIS — Z0000 Encounter for general adult medical examination without abnormal findings: Secondary | ICD-10-CM | POA: Diagnosis not present

## 2017-01-23 DIAGNOSIS — Z1389 Encounter for screening for other disorder: Secondary | ICD-10-CM | POA: Diagnosis not present

## 2017-01-23 DIAGNOSIS — Z86711 Personal history of pulmonary embolism: Secondary | ICD-10-CM | POA: Diagnosis not present

## 2017-01-27 DIAGNOSIS — H2513 Age-related nuclear cataract, bilateral: Secondary | ICD-10-CM | POA: Diagnosis not present

## 2017-01-27 DIAGNOSIS — H524 Presbyopia: Secondary | ICD-10-CM | POA: Diagnosis not present

## 2017-01-27 DIAGNOSIS — H5203 Hypermetropia, bilateral: Secondary | ICD-10-CM | POA: Diagnosis not present

## 2017-01-27 DIAGNOSIS — H52223 Regular astigmatism, bilateral: Secondary | ICD-10-CM | POA: Diagnosis not present

## 2017-02-03 DIAGNOSIS — M25562 Pain in left knee: Secondary | ICD-10-CM | POA: Diagnosis not present

## 2017-03-04 DIAGNOSIS — M25571 Pain in right ankle and joints of right foot: Secondary | ICD-10-CM | POA: Diagnosis not present

## 2017-03-05 DIAGNOSIS — D2262 Melanocytic nevi of left upper limb, including shoulder: Secondary | ICD-10-CM | POA: Diagnosis not present

## 2017-03-05 DIAGNOSIS — D1801 Hemangioma of skin and subcutaneous tissue: Secondary | ICD-10-CM | POA: Diagnosis not present

## 2017-03-05 DIAGNOSIS — Z85828 Personal history of other malignant neoplasm of skin: Secondary | ICD-10-CM | POA: Diagnosis not present

## 2017-03-05 DIAGNOSIS — L821 Other seborrheic keratosis: Secondary | ICD-10-CM | POA: Diagnosis not present

## 2017-03-05 DIAGNOSIS — D2271 Melanocytic nevi of right lower limb, including hip: Secondary | ICD-10-CM | POA: Diagnosis not present

## 2017-03-25 DIAGNOSIS — M25562 Pain in left knee: Secondary | ICD-10-CM | POA: Diagnosis not present

## 2017-04-08 ENCOUNTER — Other Ambulatory Visit: Payer: Self-pay | Admitting: Internal Medicine

## 2017-04-08 DIAGNOSIS — Z1231 Encounter for screening mammogram for malignant neoplasm of breast: Secondary | ICD-10-CM

## 2017-04-27 IMAGING — CR DG TIBIA/FIBULA 2V*L*
4 series · 4 of 4 positions shown · non-contrast
Comparison: None.

CLINICAL DATA: Pain along the mid left tibia fibula for 3 months.
No known injury.

EXAM:
LEFT TIBIA AND FIBULA - 2 VIEW

[t tib/fib ap left (1 of 2)]
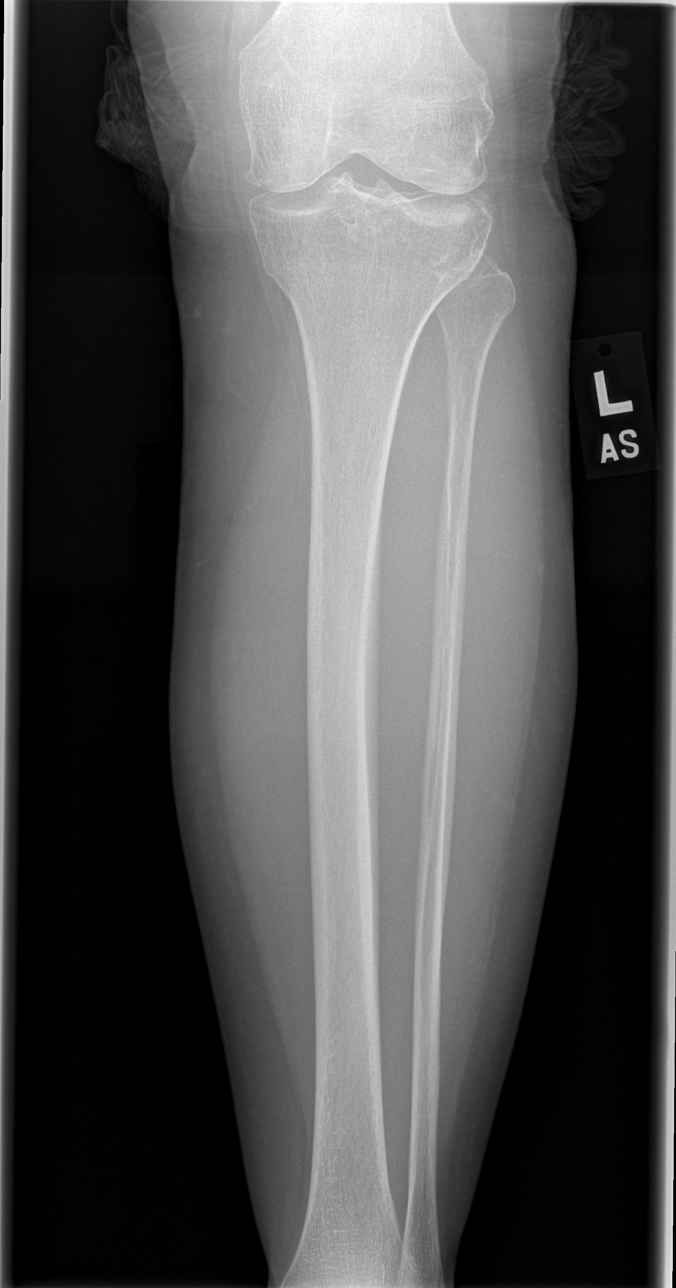

[t tib/fib ap left (2 of 2)]
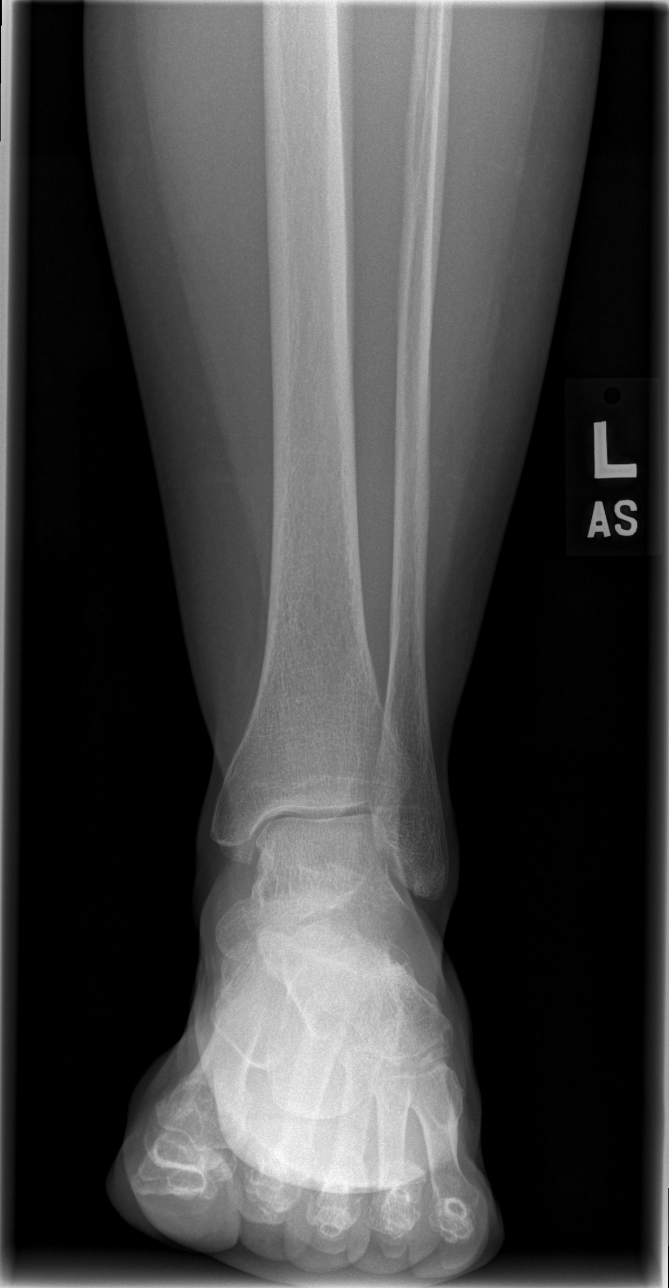

[t tib/fib lat left (1 of 2)]
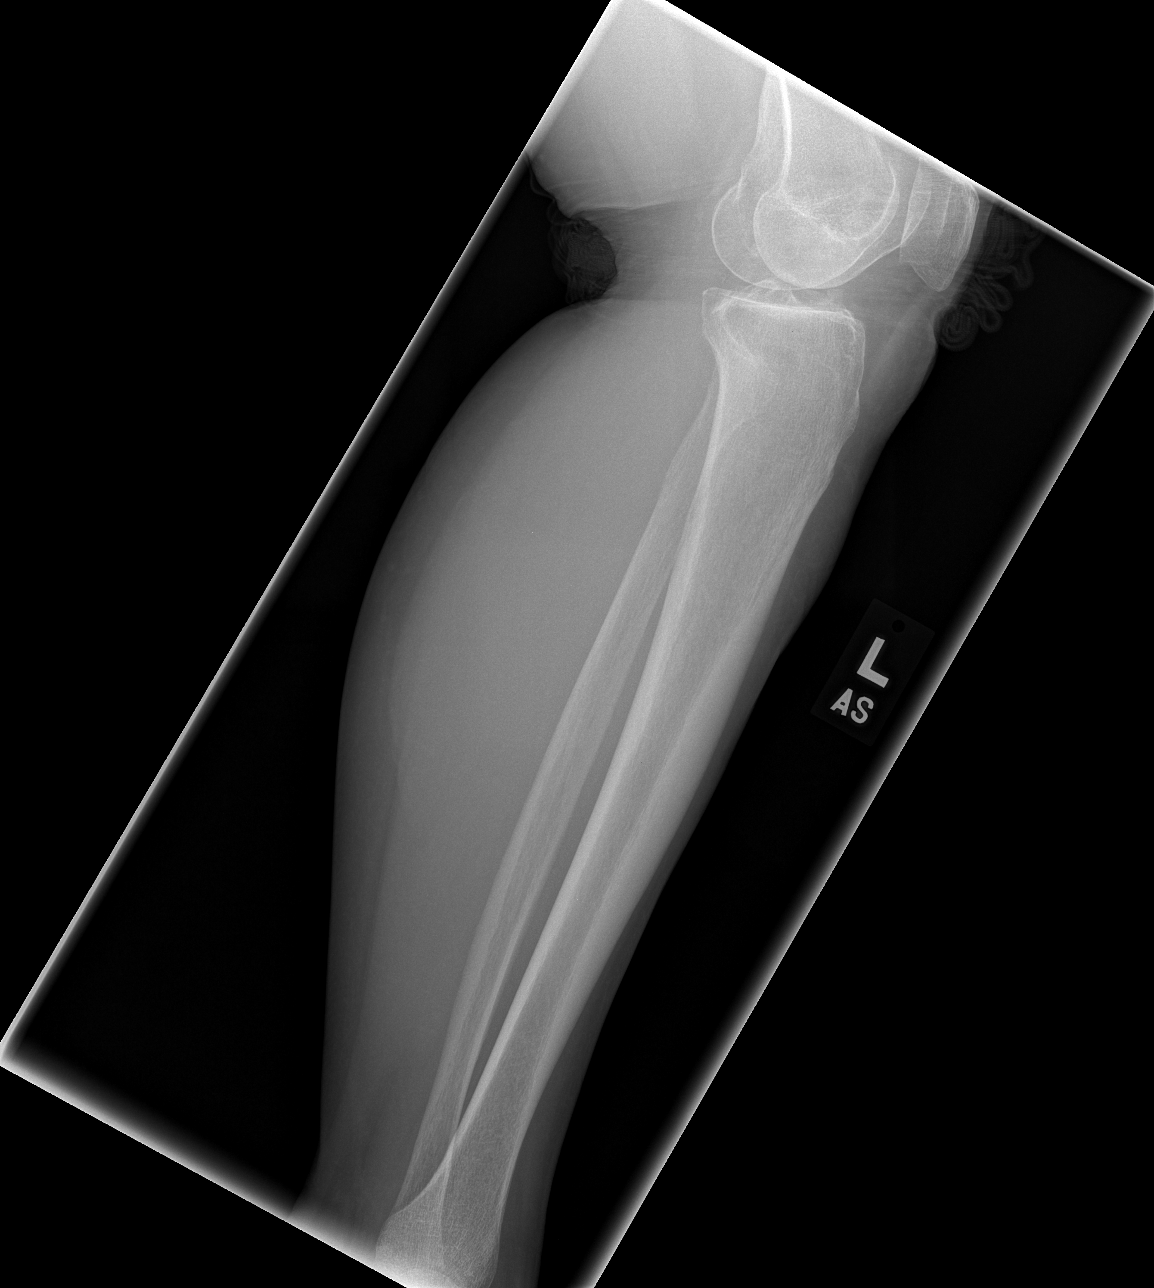

[t tib/fib lat left (2 of 2)]
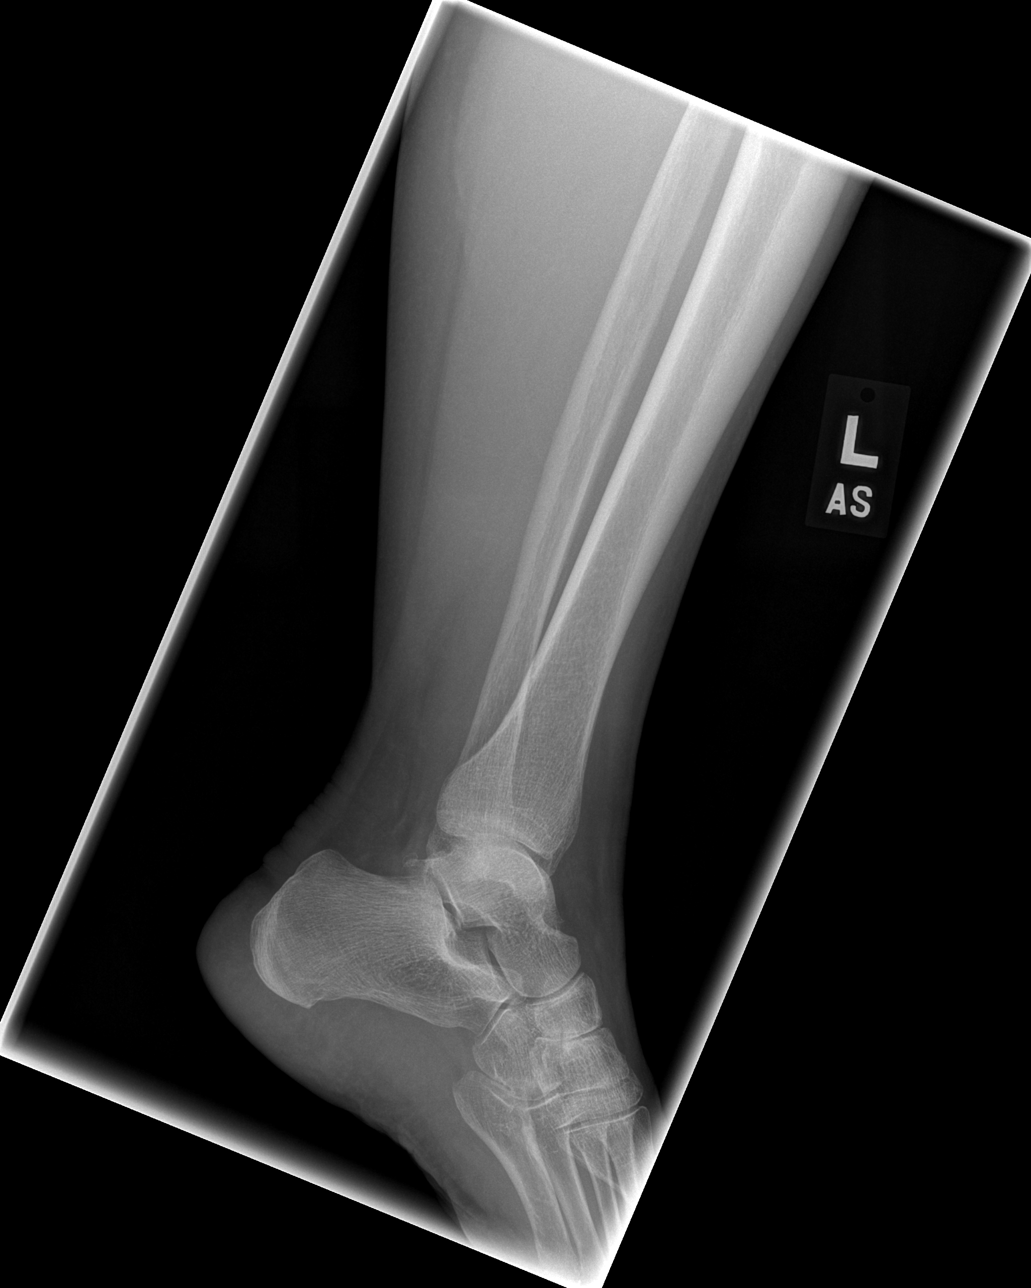

[4 of 4 positions shown; findings below may reference images not displayed]

FINDINGS: There is no evidence of fracture or other focal bone lesions. Soft
tissues are unremarkable.
IMPRESSION: Negative.

## 2017-05-06 DIAGNOSIS — Z124 Encounter for screening for malignant neoplasm of cervix: Secondary | ICD-10-CM | POA: Diagnosis not present

## 2017-05-09 DIAGNOSIS — Z23 Encounter for immunization: Secondary | ICD-10-CM | POA: Diagnosis not present

## 2017-05-20 DIAGNOSIS — M25562 Pain in left knee: Secondary | ICD-10-CM | POA: Diagnosis not present

## 2017-06-16 ENCOUNTER — Ambulatory Visit: Payer: Medicare Other

## 2017-06-23 DIAGNOSIS — M810 Age-related osteoporosis without current pathological fracture: Secondary | ICD-10-CM | POA: Diagnosis not present

## 2017-06-23 DIAGNOSIS — I10 Essential (primary) hypertension: Secondary | ICD-10-CM | POA: Diagnosis not present

## 2017-06-23 DIAGNOSIS — M159 Polyosteoarthritis, unspecified: Secondary | ICD-10-CM | POA: Diagnosis not present

## 2017-07-16 ENCOUNTER — Ambulatory Visit
Admission: RE | Admit: 2017-07-16 | Discharge: 2017-07-16 | Disposition: A | Discharge status: Home / Self Care | Payer: Medicare Other | Source: Ambulatory Visit | Attending: Internal Medicine | Admitting: Internal Medicine

## 2017-07-16 DIAGNOSIS — Z1231 Encounter for screening mammogram for malignant neoplasm of breast: Secondary | ICD-10-CM

## 2017-07-29 DIAGNOSIS — I10 Essential (primary) hypertension: Secondary | ICD-10-CM | POA: Diagnosis not present

## 2017-07-29 DIAGNOSIS — M810 Age-related osteoporosis without current pathological fracture: Secondary | ICD-10-CM | POA: Diagnosis not present

## 2017-10-01 DIAGNOSIS — M159 Polyosteoarthritis, unspecified: Secondary | ICD-10-CM | POA: Diagnosis not present

## 2017-10-01 DIAGNOSIS — M810 Age-related osteoporosis without current pathological fracture: Secondary | ICD-10-CM | POA: Diagnosis not present

## 2017-10-01 DIAGNOSIS — I10 Essential (primary) hypertension: Secondary | ICD-10-CM | POA: Diagnosis not present

## 2017-10-03 DIAGNOSIS — M25561 Pain in right knee: Secondary | ICD-10-CM | POA: Diagnosis not present

## 2017-10-13 DIAGNOSIS — M25561 Pain in right knee: Secondary | ICD-10-CM | POA: Diagnosis not present

## 2017-10-23 DIAGNOSIS — M25561 Pain in right knee: Secondary | ICD-10-CM | POA: Diagnosis not present

## 2017-10-28 DIAGNOSIS — M25561 Pain in right knee: Secondary | ICD-10-CM | POA: Diagnosis not present

## 2017-12-04 DIAGNOSIS — M810 Age-related osteoporosis without current pathological fracture: Secondary | ICD-10-CM | POA: Diagnosis not present

## 2017-12-19 ENCOUNTER — Ambulatory Visit
Admission: RE | Admit: 2017-12-19 | Discharge: 2017-12-19 | Disposition: A | Discharge status: Home / Self Care | Payer: Medicare Other | Source: Ambulatory Visit | Attending: Internal Medicine | Admitting: Internal Medicine

## 2017-12-19 ENCOUNTER — Other Ambulatory Visit: Payer: Self-pay | Admitting: Internal Medicine

## 2017-12-19 DIAGNOSIS — M549 Dorsalgia, unspecified: Secondary | ICD-10-CM

## 2017-12-19 DIAGNOSIS — M47814 Spondylosis without myelopathy or radiculopathy, thoracic region: Secondary | ICD-10-CM | POA: Diagnosis not present

## 2017-12-19 DIAGNOSIS — M810 Age-related osteoporosis without current pathological fracture: Secondary | ICD-10-CM | POA: Diagnosis not present

## 2017-12-19 DIAGNOSIS — R5383 Other fatigue: Secondary | ICD-10-CM | POA: Diagnosis not present

## 2017-12-29 DIAGNOSIS — M549 Dorsalgia, unspecified: Secondary | ICD-10-CM | POA: Diagnosis not present

## 2018-01-02 DIAGNOSIS — H6123 Impacted cerumen, bilateral: Secondary | ICD-10-CM | POA: Diagnosis not present

## 2018-01-02 DIAGNOSIS — J301 Allergic rhinitis due to pollen: Secondary | ICD-10-CM | POA: Diagnosis not present

## 2018-01-14 DIAGNOSIS — M545 Low back pain: Secondary | ICD-10-CM | POA: Diagnosis not present

## 2018-01-21 DIAGNOSIS — M545 Low back pain: Secondary | ICD-10-CM | POA: Diagnosis not present

## 2018-01-26 DIAGNOSIS — M545 Low back pain: Secondary | ICD-10-CM | POA: Diagnosis not present

## 2018-01-29 DIAGNOSIS — M545 Low back pain: Secondary | ICD-10-CM | POA: Diagnosis not present

## 2018-02-04 DIAGNOSIS — M545 Low back pain: Secondary | ICD-10-CM | POA: Diagnosis not present

## 2018-02-06 DIAGNOSIS — M545 Low back pain: Secondary | ICD-10-CM | POA: Diagnosis not present

## 2018-02-23 DIAGNOSIS — M810 Age-related osteoporosis without current pathological fracture: Secondary | ICD-10-CM | POA: Diagnosis not present

## 2018-02-23 DIAGNOSIS — R040 Epistaxis: Secondary | ICD-10-CM | POA: Diagnosis not present

## 2018-02-23 DIAGNOSIS — Z1389 Encounter for screening for other disorder: Secondary | ICD-10-CM | POA: Diagnosis not present

## 2018-02-23 DIAGNOSIS — Z0000 Encounter for general adult medical examination without abnormal findings: Secondary | ICD-10-CM | POA: Diagnosis not present

## 2018-02-23 DIAGNOSIS — Z7901 Long term (current) use of anticoagulants: Secondary | ICD-10-CM | POA: Diagnosis not present

## 2018-02-23 DIAGNOSIS — I10 Essential (primary) hypertension: Secondary | ICD-10-CM | POA: Diagnosis not present

## 2018-02-24 DIAGNOSIS — M545 Low back pain: Secondary | ICD-10-CM | POA: Diagnosis not present

## 2018-02-26 DIAGNOSIS — M545 Low back pain: Secondary | ICD-10-CM | POA: Diagnosis not present

## 2018-03-17 DIAGNOSIS — H2513 Age-related nuclear cataract, bilateral: Secondary | ICD-10-CM | POA: Diagnosis not present

## 2018-04-13 DIAGNOSIS — D2271 Melanocytic nevi of right lower limb, including hip: Secondary | ICD-10-CM | POA: Diagnosis not present

## 2018-04-13 DIAGNOSIS — D224 Melanocytic nevi of scalp and neck: Secondary | ICD-10-CM | POA: Diagnosis not present

## 2018-04-13 DIAGNOSIS — D1801 Hemangioma of skin and subcutaneous tissue: Secondary | ICD-10-CM | POA: Diagnosis not present

## 2018-04-13 DIAGNOSIS — Z85828 Personal history of other malignant neoplasm of skin: Secondary | ICD-10-CM | POA: Diagnosis not present

## 2018-04-13 DIAGNOSIS — D2261 Melanocytic nevi of right upper limb, including shoulder: Secondary | ICD-10-CM | POA: Diagnosis not present

## 2018-04-13 DIAGNOSIS — L821 Other seborrheic keratosis: Secondary | ICD-10-CM | POA: Diagnosis not present

## 2018-05-07 DIAGNOSIS — R040 Epistaxis: Secondary | ICD-10-CM | POA: Diagnosis not present

## 2018-05-14 DIAGNOSIS — M2022 Hallux rigidus, left foot: Secondary | ICD-10-CM | POA: Diagnosis not present

## 2018-06-15 ENCOUNTER — Other Ambulatory Visit: Payer: Self-pay | Admitting: Internal Medicine

## 2018-06-15 DIAGNOSIS — Z1231 Encounter for screening mammogram for malignant neoplasm of breast: Secondary | ICD-10-CM

## 2018-07-16 DIAGNOSIS — M2022 Hallux rigidus, left foot: Secondary | ICD-10-CM | POA: Diagnosis not present

## 2018-07-22 ENCOUNTER — Ambulatory Visit
Admission: RE | Admit: 2018-07-22 | Discharge: 2018-07-22 | Disposition: A | Discharge status: Home / Self Care | Payer: Medicare Other | Source: Ambulatory Visit | Attending: Internal Medicine | Admitting: Internal Medicine

## 2018-07-22 DIAGNOSIS — Z1231 Encounter for screening mammogram for malignant neoplasm of breast: Secondary | ICD-10-CM

## 2018-07-23 DIAGNOSIS — Z23 Encounter for immunization: Secondary | ICD-10-CM | POA: Diagnosis not present

## 2018-08-28 DIAGNOSIS — I10 Essential (primary) hypertension: Secondary | ICD-10-CM | POA: Diagnosis not present

## 2019-03-02 DIAGNOSIS — Z0000 Encounter for general adult medical examination without abnormal findings: Secondary | ICD-10-CM | POA: Diagnosis not present

## 2019-03-02 DIAGNOSIS — Z1389 Encounter for screening for other disorder: Secondary | ICD-10-CM | POA: Diagnosis not present

## 2019-03-03 DIAGNOSIS — Z23 Encounter for immunization: Secondary | ICD-10-CM | POA: Diagnosis not present

## 2019-03-10 DIAGNOSIS — H2513 Age-related nuclear cataract, bilateral: Secondary | ICD-10-CM | POA: Diagnosis not present

## 2019-03-25 DIAGNOSIS — M810 Age-related osteoporosis without current pathological fracture: Secondary | ICD-10-CM | POA: Diagnosis not present

## 2019-03-25 DIAGNOSIS — M1288 Other specific arthropathies, not elsewhere classified, other specified site: Secondary | ICD-10-CM | POA: Diagnosis not present

## 2019-03-25 DIAGNOSIS — I10 Essential (primary) hypertension: Secondary | ICD-10-CM | POA: Diagnosis not present

## 2019-03-25 DIAGNOSIS — Z86711 Personal history of pulmonary embolism: Secondary | ICD-10-CM | POA: Diagnosis not present

## 2019-03-25 DIAGNOSIS — Z7901 Long term (current) use of anticoagulants: Secondary | ICD-10-CM | POA: Diagnosis not present

## 2019-03-25 DIAGNOSIS — G479 Sleep disorder, unspecified: Secondary | ICD-10-CM | POA: Diagnosis not present

## 2019-06-09 ENCOUNTER — Other Ambulatory Visit: Payer: Self-pay | Admitting: Family Medicine

## 2019-06-09 DIAGNOSIS — Z1231 Encounter for screening mammogram for malignant neoplasm of breast: Secondary | ICD-10-CM

## 2019-08-02 ENCOUNTER — Other Ambulatory Visit: Payer: Self-pay

## 2019-08-02 ENCOUNTER — Ambulatory Visit
Admission: RE | Admit: 2019-08-02 | Discharge: 2019-08-02 | Disposition: A | Discharge status: Home / Self Care | Payer: Medicare Other | Source: Ambulatory Visit | Attending: Family Medicine | Admitting: Family Medicine

## 2019-08-02 DIAGNOSIS — Z1231 Encounter for screening mammogram for malignant neoplasm of breast: Secondary | ICD-10-CM | POA: Diagnosis not present

## 2019-08-15 ENCOUNTER — Ambulatory Visit: Payer: Medicare Other | Attending: Internal Medicine

## 2019-08-15 DIAGNOSIS — Z23 Encounter for immunization: Secondary | ICD-10-CM | POA: Insufficient documentation

## 2019-08-15 NOTE — Progress Notes (Signed)
   Covid-19 Vaccination Clinic  Name:  April Hernandez    MRN: [id number] DOB: [birth year]  08/15/2019  April Hernandez was observed post Covid-19 immunization for 30 minutes based on pre-vaccination screening without incidence. She was provided with Vaccine Information Sheet and instruction to access the V-Safe system.   April Hernandez was instructed to call 911 with any severe reactions post vaccine: Marland Kitchen Difficulty breathing  . Swelling of your face and throat  . A fast heartbeat  . A bad rash all over your body  . Dizziness and weakness    Immunizations Administered    Name Date Dose VIS Date Route   Pfizer COVID-19 Vaccine 08/15/2019  2:30 PM 0.3 mL 06/18/2019 Intramuscular   Manufacturer: [place]   Lot: [id number]   [place]: [id number]

## 2019-08-30 ENCOUNTER — Ambulatory Visit: Payer: Medicare Other

## 2019-09-08 ENCOUNTER — Ambulatory Visit: Payer: Medicare Other

## 2019-09-08 ENCOUNTER — Ambulatory Visit: Payer: Medicare Other | Attending: Internal Medicine

## 2019-09-08 DIAGNOSIS — Z23 Encounter for immunization: Secondary | ICD-10-CM

## 2019-09-08 NOTE — Progress Notes (Signed)
   Covid-19 Vaccination Clinic  Name:  April Hernandez    MRN: [id number] DOB: April 11, 1941  09/08/2019  [name] was observed post Covid-19 immunization for 30 minutes based on pre-vaccination screening without incident. She was provided with Vaccine Information Sheet and instruction to access the V-Safe system.   [name] was instructed to call 911 with any severe reactions post vaccine: Marland Kitchen Difficulty breathing  . Swelling of face and throat  . A fast heartbeat  . A bad rash all over body  . Dizziness and weakness   Immunizations Administered    Name Date Dose VIS Date Route   Pfizer COVID-19 Vaccine 09/08/2019 11:11 AM 0.3 mL 06/18/2019 Intramuscular   Manufacturer: [place]   Lot: [id number]   [place]: [id number]

## 2019-09-20 DIAGNOSIS — I10 Essential (primary) hypertension: Secondary | ICD-10-CM | POA: Diagnosis not present

## 2019-11-08 DIAGNOSIS — R21 Rash and other nonspecific skin eruption: Secondary | ICD-10-CM | POA: Diagnosis not present

## 2019-11-08 DIAGNOSIS — I10 Essential (primary) hypertension: Secondary | ICD-10-CM | POA: Diagnosis not present

## 2020-03-08 DIAGNOSIS — Z23 Encounter for immunization: Secondary | ICD-10-CM | POA: Diagnosis not present

## 2020-03-16 DIAGNOSIS — Z86711 Personal history of pulmonary embolism: Secondary | ICD-10-CM | POA: Diagnosis not present

## 2020-03-16 DIAGNOSIS — Z5181 Encounter for therapeutic drug level monitoring: Secondary | ICD-10-CM | POA: Diagnosis not present

## 2020-03-16 DIAGNOSIS — I10 Essential (primary) hypertension: Secondary | ICD-10-CM | POA: Diagnosis not present

## 2020-03-16 DIAGNOSIS — G479 Sleep disorder, unspecified: Secondary | ICD-10-CM | POA: Diagnosis not present

## 2020-03-16 DIAGNOSIS — N3281 Overactive bladder: Secondary | ICD-10-CM | POA: Diagnosis not present

## 2020-03-16 DIAGNOSIS — M1288 Other specific arthropathies, not elsewhere classified, other specified site: Secondary | ICD-10-CM | POA: Diagnosis not present

## 2020-03-16 DIAGNOSIS — Z136 Encounter for screening for cardiovascular disorders: Secondary | ICD-10-CM | POA: Diagnosis not present

## 2020-03-16 DIAGNOSIS — R8290 Unspecified abnormal findings in urine: Secondary | ICD-10-CM | POA: Diagnosis not present

## 2020-03-16 DIAGNOSIS — M810 Age-related osteoporosis without current pathological fracture: Secondary | ICD-10-CM | POA: Diagnosis not present

## 2020-03-16 DIAGNOSIS — Z0000 Encounter for general adult medical examination without abnormal findings: Secondary | ICD-10-CM | POA: Diagnosis not present

## 2020-03-16 DIAGNOSIS — Z1389 Encounter for screening for other disorder: Secondary | ICD-10-CM | POA: Diagnosis not present

## 2020-03-29 ENCOUNTER — Other Ambulatory Visit: Payer: Self-pay | Admitting: Internal Medicine

## 2020-03-29 DIAGNOSIS — M810 Age-related osteoporosis without current pathological fracture: Secondary | ICD-10-CM

## 2020-04-04 DIAGNOSIS — Z23 Encounter for immunization: Secondary | ICD-10-CM | POA: Diagnosis not present

## 2020-04-07 DIAGNOSIS — H2513 Age-related nuclear cataract, bilateral: Secondary | ICD-10-CM | POA: Diagnosis not present

## 2020-07-06 ENCOUNTER — Ambulatory Visit
Admission: RE | Admit: 2020-07-06 | Discharge: 2020-07-06 | Disposition: A | Discharge status: Home / Self Care | Payer: Medicare Other | Source: Ambulatory Visit | Attending: Internal Medicine | Admitting: Internal Medicine

## 2020-07-06 ENCOUNTER — Other Ambulatory Visit: Payer: Self-pay

## 2020-07-06 DIAGNOSIS — Z780 Asymptomatic menopausal state: Secondary | ICD-10-CM | POA: Diagnosis not present

## 2020-07-06 DIAGNOSIS — M8588 Other specified disorders of bone density and structure, other site: Secondary | ICD-10-CM | POA: Diagnosis not present

## 2020-07-06 DIAGNOSIS — M810 Age-related osteoporosis without current pathological fracture: Secondary | ICD-10-CM | POA: Diagnosis not present

## 2020-09-19 DIAGNOSIS — I10 Essential (primary) hypertension: Secondary | ICD-10-CM | POA: Diagnosis not present

## 2020-09-19 DIAGNOSIS — M810 Age-related osteoporosis without current pathological fracture: Secondary | ICD-10-CM | POA: Diagnosis not present

## 2020-09-27 ENCOUNTER — Other Ambulatory Visit: Payer: Self-pay | Admitting: Internal Medicine

## 2020-09-27 DIAGNOSIS — R42 Dizziness and giddiness: Secondary | ICD-10-CM

## 2020-10-05 DIAGNOSIS — Z23 Encounter for immunization: Secondary | ICD-10-CM | POA: Diagnosis not present

## 2020-11-20 ENCOUNTER — Ambulatory Visit
Admission: RE | Admit: 2020-11-20 | Discharge: 2020-11-20 | Disposition: A | Discharge status: Home / Self Care | Payer: Medicare Other | Source: Ambulatory Visit | Attending: Internal Medicine | Admitting: Internal Medicine

## 2020-11-20 ENCOUNTER — Other Ambulatory Visit: Payer: Self-pay

## 2020-11-20 DIAGNOSIS — R42 Dizziness and giddiness: Secondary | ICD-10-CM

## 2020-11-20 DIAGNOSIS — Z1231 Encounter for screening mammogram for malignant neoplasm of breast: Secondary | ICD-10-CM | POA: Diagnosis not present

## 2021-03-16 DIAGNOSIS — Z23 Encounter for immunization: Secondary | ICD-10-CM | POA: Diagnosis not present

## 2021-03-23 DIAGNOSIS — M810 Age-related osteoporosis without current pathological fracture: Secondary | ICD-10-CM | POA: Diagnosis not present

## 2021-03-23 DIAGNOSIS — I10 Essential (primary) hypertension: Secondary | ICD-10-CM | POA: Diagnosis not present

## 2021-03-23 DIAGNOSIS — G479 Sleep disorder, unspecified: Secondary | ICD-10-CM | POA: Diagnosis not present

## 2021-03-23 DIAGNOSIS — D6869 Other thrombophilia: Secondary | ICD-10-CM | POA: Diagnosis not present

## 2021-03-23 DIAGNOSIS — Z1389 Encounter for screening for other disorder: Secondary | ICD-10-CM | POA: Diagnosis not present

## 2021-03-23 DIAGNOSIS — D6851 Activated protein C resistance: Secondary | ICD-10-CM | POA: Diagnosis not present

## 2021-03-23 DIAGNOSIS — Z0000 Encounter for general adult medical examination without abnormal findings: Secondary | ICD-10-CM | POA: Diagnosis not present

## 2021-03-23 DIAGNOSIS — Z86711 Personal history of pulmonary embolism: Secondary | ICD-10-CM | POA: Diagnosis not present

## 2021-03-28 DIAGNOSIS — M2022 Hallux rigidus, left foot: Secondary | ICD-10-CM | POA: Diagnosis not present

## 2021-05-03 DIAGNOSIS — H2513 Age-related nuclear cataract, bilateral: Secondary | ICD-10-CM | POA: Diagnosis not present

## 2021-07-25 DIAGNOSIS — M2022 Hallux rigidus, left foot: Secondary | ICD-10-CM | POA: Diagnosis not present

## 2021-09-12 DIAGNOSIS — R196 Halitosis: Secondary | ICD-10-CM | POA: Diagnosis not present

## 2021-09-12 DIAGNOSIS — R0989 Other specified symptoms and signs involving the circulatory and respiratory systems: Secondary | ICD-10-CM | POA: Diagnosis not present

## 2021-09-20 DIAGNOSIS — I10 Essential (primary) hypertension: Secondary | ICD-10-CM | POA: Diagnosis not present

## 2021-09-25 DIAGNOSIS — Z20822 Contact with and (suspected) exposure to covid-19: Secondary | ICD-10-CM | POA: Diagnosis not present

## 2021-10-01 DIAGNOSIS — Z20822 Contact with and (suspected) exposure to covid-19: Secondary | ICD-10-CM | POA: Diagnosis not present

## 2021-10-07 DIAGNOSIS — Z20822 Contact with and (suspected) exposure to covid-19: Secondary | ICD-10-CM | POA: Diagnosis not present

## 2021-10-18 ENCOUNTER — Other Ambulatory Visit: Payer: Self-pay | Admitting: Internal Medicine

## 2021-10-18 DIAGNOSIS — Z1231 Encounter for screening mammogram for malignant neoplasm of breast: Secondary | ICD-10-CM

## 2021-11-21 ENCOUNTER — Ambulatory Visit
Admission: RE | Admit: 2021-11-21 | Discharge: 2021-11-21 | Disposition: A | Discharge status: Home / Self Care | Payer: Medicare Other | Source: Ambulatory Visit | Attending: Internal Medicine | Admitting: Internal Medicine

## 2021-11-21 DIAGNOSIS — Z1231 Encounter for screening mammogram for malignant neoplasm of breast: Secondary | ICD-10-CM | POA: Diagnosis not present

## 2021-11-22 ENCOUNTER — Other Ambulatory Visit: Payer: Self-pay | Admitting: Internal Medicine

## 2021-11-22 DIAGNOSIS — R928 Other abnormal and inconclusive findings on diagnostic imaging of breast: Secondary | ICD-10-CM

## 2021-11-30 ENCOUNTER — Other Ambulatory Visit: Payer: Self-pay | Admitting: Internal Medicine

## 2021-11-30 ENCOUNTER — Ambulatory Visit
Admission: RE | Admit: 2021-11-30 | Discharge: 2021-11-30 | Disposition: A | Discharge status: Home / Self Care | Payer: Medicare Other | Source: Ambulatory Visit | Attending: Internal Medicine | Admitting: Internal Medicine

## 2021-11-30 DIAGNOSIS — R928 Other abnormal and inconclusive findings on diagnostic imaging of breast: Secondary | ICD-10-CM | POA: Diagnosis not present

## 2021-11-30 DIAGNOSIS — N6489 Other specified disorders of breast: Secondary | ICD-10-CM | POA: Diagnosis not present

## 2021-11-30 DIAGNOSIS — N6310 Unspecified lump in the right breast, unspecified quadrant: Secondary | ICD-10-CM

## 2021-12-12 ENCOUNTER — Ambulatory Visit
Admission: RE | Admit: 2021-12-12 | Discharge: 2021-12-12 | Disposition: A | Discharge status: Home / Self Care | Payer: Medicare Other | Source: Ambulatory Visit | Attending: Internal Medicine | Admitting: Internal Medicine

## 2021-12-12 ENCOUNTER — Other Ambulatory Visit: Payer: Self-pay | Admitting: Internal Medicine

## 2021-12-12 DIAGNOSIS — N6310 Unspecified lump in the right breast, unspecified quadrant: Secondary | ICD-10-CM

## 2021-12-19 DIAGNOSIS — R040 Epistaxis: Secondary | ICD-10-CM | POA: Diagnosis not present

## 2021-12-19 DIAGNOSIS — K219 Gastro-esophageal reflux disease without esophagitis: Secondary | ICD-10-CM | POA: Diagnosis not present

## 2021-12-19 DIAGNOSIS — R0989 Other specified symptoms and signs involving the circulatory and respiratory systems: Secondary | ICD-10-CM | POA: Diagnosis not present

## 2022-03-07 DIAGNOSIS — Z23 Encounter for immunization: Secondary | ICD-10-CM | POA: Diagnosis not present

## 2022-03-28 DIAGNOSIS — I10 Essential (primary) hypertension: Secondary | ICD-10-CM | POA: Diagnosis not present

## 2022-03-28 DIAGNOSIS — Z86711 Personal history of pulmonary embolism: Secondary | ICD-10-CM | POA: Diagnosis not present

## 2022-03-28 DIAGNOSIS — M810 Age-related osteoporosis without current pathological fracture: Secondary | ICD-10-CM | POA: Diagnosis not present

## 2022-03-28 DIAGNOSIS — D6851 Activated protein C resistance: Secondary | ICD-10-CM | POA: Diagnosis not present

## 2022-03-28 DIAGNOSIS — D6869 Other thrombophilia: Secondary | ICD-10-CM | POA: Diagnosis not present

## 2022-03-28 DIAGNOSIS — M1288 Other specific arthropathies, not elsewhere classified, other specified site: Secondary | ICD-10-CM | POA: Diagnosis not present

## 2022-03-28 DIAGNOSIS — Z1331 Encounter for screening for depression: Secondary | ICD-10-CM | POA: Diagnosis not present

## 2022-03-28 DIAGNOSIS — Z23 Encounter for immunization: Secondary | ICD-10-CM | POA: Diagnosis not present

## 2022-03-28 DIAGNOSIS — Z0000 Encounter for general adult medical examination without abnormal findings: Secondary | ICD-10-CM | POA: Diagnosis not present

## 2022-04-11 DIAGNOSIS — H25813 Combined forms of age-related cataract, bilateral: Secondary | ICD-10-CM | POA: Diagnosis not present

## 2022-05-06 DIAGNOSIS — I10 Essential (primary) hypertension: Secondary | ICD-10-CM | POA: Diagnosis not present

## 2022-06-03 DIAGNOSIS — I10 Essential (primary) hypertension: Secondary | ICD-10-CM | POA: Diagnosis not present

## 2022-06-03 DIAGNOSIS — R351 Nocturia: Secondary | ICD-10-CM | POA: Diagnosis not present

## 2022-06-05 ENCOUNTER — Other Ambulatory Visit: Payer: Self-pay | Admitting: Internal Medicine

## 2022-06-05 ENCOUNTER — Ambulatory Visit
Admission: RE | Admit: 2022-06-05 | Discharge: 2022-06-05 | Disposition: A | Discharge status: Home / Self Care | Payer: Medicare Other | Source: Ambulatory Visit | Attending: Internal Medicine | Admitting: Internal Medicine

## 2022-06-05 DIAGNOSIS — N6310 Unspecified lump in the right breast, unspecified quadrant: Secondary | ICD-10-CM

## 2022-06-05 DIAGNOSIS — R928 Other abnormal and inconclusive findings on diagnostic imaging of breast: Secondary | ICD-10-CM | POA: Diagnosis not present

## 2022-07-11 DIAGNOSIS — I10 Essential (primary) hypertension: Secondary | ICD-10-CM | POA: Diagnosis not present

## 2022-07-11 DIAGNOSIS — G479 Sleep disorder, unspecified: Secondary | ICD-10-CM | POA: Diagnosis not present

## 2022-07-11 DIAGNOSIS — L609 Nail disorder, unspecified: Secondary | ICD-10-CM | POA: Diagnosis not present

## 2022-09-23 DIAGNOSIS — M810 Age-related osteoporosis without current pathological fracture: Secondary | ICD-10-CM | POA: Diagnosis not present

## 2022-09-23 DIAGNOSIS — D6851 Activated protein C resistance: Secondary | ICD-10-CM | POA: Diagnosis not present

## 2022-09-23 DIAGNOSIS — M159 Polyosteoarthritis, unspecified: Secondary | ICD-10-CM | POA: Diagnosis not present

## 2022-09-23 DIAGNOSIS — I10 Essential (primary) hypertension: Secondary | ICD-10-CM | POA: Diagnosis not present

## 2022-09-23 DIAGNOSIS — D1803 Hemangioma of intra-abdominal structures: Secondary | ICD-10-CM | POA: Diagnosis not present

## 2022-11-19 DIAGNOSIS — H25813 Combined forms of age-related cataract, bilateral: Secondary | ICD-10-CM | POA: Diagnosis not present

## 2022-11-19 DIAGNOSIS — H35033 Hypertensive retinopathy, bilateral: Secondary | ICD-10-CM | POA: Diagnosis not present

## 2022-11-19 DIAGNOSIS — H35373 Puckering of macula, bilateral: Secondary | ICD-10-CM | POA: Diagnosis not present

## 2022-12-05 ENCOUNTER — Ambulatory Visit
Admission: RE | Admit: 2022-12-05 | Discharge: 2022-12-05 | Disposition: A | Discharge status: Home / Self Care | Payer: Medicare Other | Source: Ambulatory Visit | Attending: Internal Medicine | Admitting: Internal Medicine

## 2022-12-05 DIAGNOSIS — R92323 Mammographic fibroglandular density, bilateral breasts: Secondary | ICD-10-CM | POA: Diagnosis not present

## 2022-12-05 DIAGNOSIS — N6310 Unspecified lump in the right breast, unspecified quadrant: Secondary | ICD-10-CM

## 2023-03-12 DIAGNOSIS — Z23 Encounter for immunization: Secondary | ICD-10-CM | POA: Diagnosis not present

## 2023-03-19 DIAGNOSIS — E559 Vitamin D deficiency, unspecified: Secondary | ICD-10-CM | POA: Diagnosis not present

## 2023-03-19 DIAGNOSIS — I10 Essential (primary) hypertension: Secondary | ICD-10-CM | POA: Diagnosis not present

## 2023-03-19 DIAGNOSIS — D6851 Activated protein C resistance: Secondary | ICD-10-CM | POA: Diagnosis not present

## 2023-03-19 DIAGNOSIS — R202 Paresthesia of skin: Secondary | ICD-10-CM | POA: Diagnosis not present

## 2023-03-19 DIAGNOSIS — M810 Age-related osteoporosis without current pathological fracture: Secondary | ICD-10-CM | POA: Diagnosis not present

## 2023-03-19 DIAGNOSIS — R6889 Other general symptoms and signs: Secondary | ICD-10-CM | POA: Diagnosis not present

## 2023-03-19 DIAGNOSIS — E785 Hyperlipidemia, unspecified: Secondary | ICD-10-CM | POA: Diagnosis not present

## 2023-03-21 ENCOUNTER — Other Ambulatory Visit: Payer: Self-pay | Admitting: Internal Medicine

## 2023-03-21 DIAGNOSIS — M810 Age-related osteoporosis without current pathological fracture: Secondary | ICD-10-CM

## 2023-04-02 DIAGNOSIS — E785 Hyperlipidemia, unspecified: Secondary | ICD-10-CM | POA: Diagnosis not present

## 2023-04-02 DIAGNOSIS — D6869 Other thrombophilia: Secondary | ICD-10-CM | POA: Diagnosis not present

## 2023-04-02 DIAGNOSIS — Z1331 Encounter for screening for depression: Secondary | ICD-10-CM | POA: Diagnosis not present

## 2023-04-02 DIAGNOSIS — Z0000 Encounter for general adult medical examination without abnormal findings: Secondary | ICD-10-CM | POA: Diagnosis not present

## 2023-04-02 DIAGNOSIS — M810 Age-related osteoporosis without current pathological fracture: Secondary | ICD-10-CM | POA: Diagnosis not present

## 2023-04-02 DIAGNOSIS — J301 Allergic rhinitis due to pollen: Secondary | ICD-10-CM | POA: Diagnosis not present

## 2023-04-02 DIAGNOSIS — D6851 Activated protein C resistance: Secondary | ICD-10-CM | POA: Diagnosis not present

## 2023-04-02 DIAGNOSIS — Z86711 Personal history of pulmonary embolism: Secondary | ICD-10-CM | POA: Diagnosis not present

## 2023-04-02 DIAGNOSIS — I10 Essential (primary) hypertension: Secondary | ICD-10-CM | POA: Diagnosis not present

## 2023-05-22 DIAGNOSIS — H35373 Puckering of macula, bilateral: Secondary | ICD-10-CM | POA: Diagnosis not present

## 2023-05-22 DIAGNOSIS — H35033 Hypertensive retinopathy, bilateral: Secondary | ICD-10-CM | POA: Diagnosis not present

## 2023-05-22 DIAGNOSIS — H25813 Combined forms of age-related cataract, bilateral: Secondary | ICD-10-CM | POA: Diagnosis not present

## 2023-09-30 DIAGNOSIS — I10 Essential (primary) hypertension: Secondary | ICD-10-CM | POA: Diagnosis not present

## 2023-09-30 DIAGNOSIS — E785 Hyperlipidemia, unspecified: Secondary | ICD-10-CM | POA: Diagnosis not present

## 2023-09-30 DIAGNOSIS — D6869 Other thrombophilia: Secondary | ICD-10-CM | POA: Diagnosis not present

## 2023-09-30 DIAGNOSIS — D6851 Activated protein C resistance: Secondary | ICD-10-CM | POA: Diagnosis not present

## 2023-09-30 DIAGNOSIS — Z86711 Personal history of pulmonary embolism: Secondary | ICD-10-CM | POA: Diagnosis not present

## 2023-10-02 ENCOUNTER — Ambulatory Visit
Admission: RE | Admit: 2023-10-02 | Discharge: 2023-10-02 | Disposition: A | Discharge status: Home / Self Care | Payer: PRIVATE HEALTH INSURANCE | Source: Ambulatory Visit | Attending: Internal Medicine | Admitting: Internal Medicine

## 2023-10-02 DIAGNOSIS — M8588 Other specified disorders of bone density and structure, other site: Secondary | ICD-10-CM | POA: Diagnosis not present

## 2023-10-02 DIAGNOSIS — M810 Age-related osteoporosis without current pathological fracture: Secondary | ICD-10-CM

## 2023-10-02 DIAGNOSIS — E2839 Other primary ovarian failure: Secondary | ICD-10-CM | POA: Diagnosis not present

## 2023-10-02 DIAGNOSIS — N958 Other specified menopausal and perimenopausal disorders: Secondary | ICD-10-CM | POA: Diagnosis not present

## 2023-10-06 DIAGNOSIS — E785 Hyperlipidemia, unspecified: Secondary | ICD-10-CM | POA: Diagnosis not present

## 2023-10-06 DIAGNOSIS — M159 Polyosteoarthritis, unspecified: Secondary | ICD-10-CM | POA: Diagnosis not present

## 2023-10-06 DIAGNOSIS — I10 Essential (primary) hypertension: Secondary | ICD-10-CM | POA: Diagnosis not present

## 2023-10-06 DIAGNOSIS — M810 Age-related osteoporosis without current pathological fracture: Secondary | ICD-10-CM | POA: Diagnosis not present

## 2023-11-25 ENCOUNTER — Other Ambulatory Visit: Payer: Self-pay | Admitting: Internal Medicine

## 2023-11-25 DIAGNOSIS — Z0000 Encounter for general adult medical examination without abnormal findings: Secondary | ICD-10-CM

## 2023-12-08 ENCOUNTER — Ambulatory Visit
Admission: RE | Admit: 2023-12-08 | Discharge: 2023-12-08 | Disposition: A | Discharge status: Home / Self Care | Payer: PRIVATE HEALTH INSURANCE | Source: Ambulatory Visit | Attending: Internal Medicine | Admitting: Internal Medicine

## 2023-12-08 DIAGNOSIS — Z0000 Encounter for general adult medical examination without abnormal findings: Secondary | ICD-10-CM

## 2024-03-01 ENCOUNTER — Other Ambulatory Visit (HOSPITAL_BASED_OUTPATIENT_CLINIC_OR_DEPARTMENT_OTHER): Payer: Self-pay

## 2024-03-01 DIAGNOSIS — Z23 Encounter for immunization: Secondary | ICD-10-CM | POA: Diagnosis not present

## 2024-03-01 MED ORDER — CAPVAXIVE 0.5 ML IM SOSY
PREFILLED_SYRINGE | INTRAMUSCULAR | 0 refills | Status: AC
  Filled 2024-03-01: qty 0.5, 1d supply, fill #0

## 2024-03-10 ENCOUNTER — Other Ambulatory Visit (HOSPITAL_BASED_OUTPATIENT_CLINIC_OR_DEPARTMENT_OTHER): Payer: Self-pay

## 2024-03-10 DIAGNOSIS — Z23 Encounter for immunization: Secondary | ICD-10-CM | POA: Diagnosis not present

## 2024-03-10 MED ORDER — FLUZONE HIGH-DOSE 0.5 ML IM SUSY
0.5000 mL | PREFILLED_SYRINGE | Freq: Once | INTRAMUSCULAR | 0 refills | Status: AC
  Filled 2024-03-10: qty 0.5, 1d supply, fill #0

## 2024-03-24 ENCOUNTER — Other Ambulatory Visit (HOSPITAL_BASED_OUTPATIENT_CLINIC_OR_DEPARTMENT_OTHER): Payer: Self-pay

## 2024-03-24 DIAGNOSIS — Z23 Encounter for immunization: Secondary | ICD-10-CM | POA: Diagnosis not present

## 2024-03-24 MED ORDER — COMIRNATY 30 MCG/0.3ML IM SUSY
0.3000 mL | PREFILLED_SYRINGE | Freq: Once | INTRAMUSCULAR | 0 refills | Status: AC
  Filled 2024-03-24: qty 0.3, 1d supply, fill #0

## 2024-04-06 DIAGNOSIS — N3289 Other specified disorders of bladder: Secondary | ICD-10-CM | POA: Diagnosis not present

## 2024-04-06 DIAGNOSIS — M1288 Other specific arthropathies, not elsewhere classified, other specified site: Secondary | ICD-10-CM | POA: Diagnosis not present

## 2024-04-06 DIAGNOSIS — E785 Hyperlipidemia, unspecified: Secondary | ICD-10-CM | POA: Diagnosis not present

## 2024-04-06 DIAGNOSIS — D1803 Hemangioma of intra-abdominal structures: Secondary | ICD-10-CM | POA: Diagnosis not present

## 2024-04-06 DIAGNOSIS — Z86711 Personal history of pulmonary embolism: Secondary | ICD-10-CM | POA: Diagnosis not present

## 2024-04-06 DIAGNOSIS — Z0000 Encounter for general adult medical examination without abnormal findings: Secondary | ICD-10-CM | POA: Diagnosis not present

## 2024-04-06 DIAGNOSIS — Z1331 Encounter for screening for depression: Secondary | ICD-10-CM | POA: Diagnosis not present

## 2024-04-06 DIAGNOSIS — D6869 Other thrombophilia: Secondary | ICD-10-CM | POA: Diagnosis not present

## 2024-04-06 DIAGNOSIS — J310 Chronic rhinitis: Secondary | ICD-10-CM | POA: Diagnosis not present

## 2024-04-06 DIAGNOSIS — I10 Essential (primary) hypertension: Secondary | ICD-10-CM | POA: Diagnosis not present

## 2024-04-06 DIAGNOSIS — M810 Age-related osteoporosis without current pathological fracture: Secondary | ICD-10-CM | POA: Diagnosis not present

## 2024-04-13 DIAGNOSIS — H35033 Hypertensive retinopathy, bilateral: Secondary | ICD-10-CM | POA: Diagnosis not present

## 2024-04-13 DIAGNOSIS — H35373 Puckering of macula, bilateral: Secondary | ICD-10-CM | POA: Diagnosis not present

## 2024-04-13 DIAGNOSIS — H25813 Combined forms of age-related cataract, bilateral: Secondary | ICD-10-CM | POA: Diagnosis not present

## 2024-04-23 ENCOUNTER — Other Ambulatory Visit (HOSPITAL_BASED_OUTPATIENT_CLINIC_OR_DEPARTMENT_OTHER): Payer: Self-pay

## 2024-04-23 MED ORDER — AREXVY 120 MCG/0.5ML IM SUSR
INTRAMUSCULAR | 0 refills | Status: AC
  Filled 2024-04-23: qty 1, 1d supply, fill #0

## 2024-06-26 ENCOUNTER — Emergency Department (HOSPITAL_COMMUNITY)
Admission: EM | Admit: 2024-06-26 | Discharge: 2024-06-26 | Disposition: A | Discharge status: Home / Self Care | Attending: Emergency Medicine | Admitting: Emergency Medicine

## 2024-06-26 ENCOUNTER — Emergency Department (HOSPITAL_COMMUNITY)

## 2024-06-26 ENCOUNTER — Other Ambulatory Visit: Payer: Self-pay

## 2024-06-26 DIAGNOSIS — R0602 Shortness of breath: Secondary | ICD-10-CM | POA: Insufficient documentation

## 2024-06-26 DIAGNOSIS — Z7901 Long term (current) use of anticoagulants: Secondary | ICD-10-CM | POA: Insufficient documentation

## 2024-06-26 DIAGNOSIS — R5383 Other fatigue: Secondary | ICD-10-CM | POA: Diagnosis not present

## 2024-06-26 LAB — BASIC METABOLIC PANEL WITH GFR
Anion gap: 9 (ref 5–15)
BUN: 29 mg/dL — ABNORMAL HIGH (ref 8–23)
CO2: 26 mmol/L (ref 22–32)
Calcium: 10.1 mg/dL (ref 8.9–10.3)
Chloride: 106 mmol/L (ref 98–111)
Creatinine, Ser: 0.77 mg/dL (ref 0.44–1.00)
GFR, Estimated: >60 mL/min
Glucose, Bld: 120 mg/dL — ABNORMAL HIGH (ref 70–99)
Potassium: 4.2 mmol/L (ref 3.5–5.1)
Sodium: 142 mmol/L (ref 135–145)

## 2024-06-26 LAB — PRO BRAIN NATRIURETIC PEPTIDE: Pro Brain Natriuretic Peptide: 296 pg/mL

## 2024-06-26 LAB — RESP PANEL BY RT-PCR (RSV, FLU A&B, COVID)  RVPGX2
Influenza A by PCR: NEGATIVE
Influenza B by PCR: NEGATIVE
Resp Syncytial Virus by PCR: NEGATIVE
SARS Coronavirus 2 by RT PCR: NEGATIVE

## 2024-06-26 LAB — CBC
HCT: 40 % (ref 36.0–46.0)
Hemoglobin: 13.2 g/dL (ref 12.0–15.0)
MCH: 28.6 pg (ref 26.0–34.0)
MCHC: 33 g/dL (ref 30.0–36.0)
MCV: 86.6 fL (ref 80.0–100.0)
Platelets: 211 K/uL (ref 150–400)
RBC: 4.62 MIL/uL (ref 3.87–5.11)
RDW: 13.2 % (ref 11.5–15.5)
WBC: 8.2 K/uL (ref 4.0–10.5)
nRBC: 0 % (ref 0.0–0.2)

## 2024-06-26 LAB — TROPONIN T, HIGH SENSITIVITY: Troponin T High Sensitivity: <15 ng/L (ref 0–19)

## 2024-06-26 MED ORDER — ALBUTEROL SULFATE HFA 108 (90 BASE) MCG/ACT IN AERS
1.0000 | INHALATION_SPRAY | RESPIRATORY_TRACT | Status: DC | PRN
  Administered 2024-06-26: 1 via RESPIRATORY_TRACT
  Filled 2024-06-26: qty 6.7

## 2024-06-26 MED ORDER — PREDNISONE 20 MG PO TABS: 40.0000 mg | ORAL_TABLET | Freq: Every day | ORAL | 0 refills | Status: AC

## 2024-06-26 MED ORDER — IOHEXOL 350 MG/ML SOLN
75.0000 mL | Freq: Once | INTRAVENOUS | Status: AC | PRN
  Administered 2024-06-26: 75 mL via INTRAVENOUS

## 2024-06-26 MED ORDER — NALOXONE HCL 0.4 MG/ML IJ SOLN: 0.4000 mg | Freq: Once | INTRAMUSCULAR | Status: DC

## 2024-06-26 NOTE — ED Notes (Signed)
 Patient was able to ambulate without assistance. During ambulation O2 sat remained in the mid 90's to 100%. Patient did report shortness of breath with respiratory rate increasing to 50.

## 2024-06-26 NOTE — ED Provider Notes (Signed)
 " [place] EMERGENCY DEPARTMENT AT [facility] Provider Note   CSN: [id number] Arrival date & time: 06/26/24  1119     Patient presents with: Fatigue and Shortness of Breath   April Hernandez is a 83 y.o. female patient with past medical history of factor V Leyden, PE on Xarelto  presents to emergency room with complaint of shortness of breath and fatigue.  Patient reports that about 10 days ago she had URI-like symptoms with runny nose and nasal congestion as well as body aches.  She reports that over the past 2 days symptoms have began to get worse.  She reports that her shortness of breath is all of the time but it is worse when walking short distances. No orthopnea. Has not missed Xarelto  dose. No DVT symptoms with this. No abdominal pain, NVD or fever.     Shortness of Breath      Prior to Admission medications  Medication Sig Start Date End Date Taking? Authorizing Provider  predniSONE  (DELTASONE ) 20 MG tablet Take 2 tablets (40 mg total) by mouth daily. 06/26/24  Yes Raunak Antuna, Warren SAILOR, PA-C  amLODipine  (NORVASC ) 5 MG tablet Take 5 mg by mouth daily.    [provider]  Calcium Carb-Cholecalciferol (CALCIUM/VITAMIN D PO) Take 1 tablet by mouth every evening.    [provider]  Cholecalciferol (VITAMIN D3) 2000 units capsule Take 2,000 Units by mouth daily.    [provider]  Coenzyme Q10 (CO Q 10) 100 MG CAPS Take 100 mg by mouth daily. With vitamin c    [provider]  docusate sodium  (COLACE) 100 MG capsule Take 1 capsule (100 mg total) by mouth 2 (two) times daily. 10/25/16   Rondall Agent, PA-C  Ferrous Sulfate  Dried (SLOW RELEASE IRON) 45 MG TBCR Take 45 mg by mouth daily.    [provider]  Multiple Vitamins-Minerals (CENTRUM SILVER PO) Take 1 tablet by mouth daily.      [provider]  Omega-3 Fatty Acids (OMEGA-3 FISH OIL) 300 MG CAPS Take 600 mg by mouth 2 (two) times daily.    [provider]   OVER THE COUNTER MEDICATION Take 2 tablets by mouth daily. Ultimate eye support otc multivitamin    [provider]  OVER THE COUNTER MEDICATION Take 2 tablets by mouth daily. Ultimate HA hyaluronic acid    [provider]  oxyCODONE -acetaminophen  (PERCOCET/ROXICET) 5-325 MG tablet Take 1-2 tablets by mouth every 6 (six) hours as needed for severe pain. 10/25/16   Rondall Agent, PA-C  pneumococcal 21-valent conjugate vaccine (CAPVAXIVE ) 0.5 ML injection Inject into the muscle. 03/01/24   Luiz Channel, MD  rivaroxaban  (XARELTO ) 20 MG TABS tablet Take 20 mg by mouth daily with supper. Will stop 10-21-16    [provider]  RSV vaccine recomb adjuvanted (AREXVY ) 120 MCG/0.5ML injection Inject into the muscle. 04/23/24   Luiz Channel, MD  tiZANidine  (ZANAFLEX ) 2 MG tablet Take 1 tablet (2 mg total) by mouth every 8 (eight) hours as needed for muscle spasms. 10/25/16   Rondall Agent, PA-C    Allergies: Char cerise allergy], Alendronate sodium, Eliquis [apixaban], and Alendronate    Review of Systems  Respiratory:  Positive for shortness of breath.     Updated Vital Signs BP (!) 155/72   Pulse 72   Temp 98.1 F (36.7 C) (Oral)   Resp 15   SpO2 100%   Physical Exam Vitals and nursing note reviewed.  Constitutional:      General:  She is not in acute distress.    Appearance: She is not toxic-appearing.  HENT:     Head: Normocephalic and atraumatic.  Eyes:     General: No scleral icterus.    Conjunctiva/sclera: Conjunctivae normal.  Cardiovascular:     Rate and Rhythm: Normal rate and regular rhythm.     Pulses: Normal pulses.     Heart sounds: Normal heart sounds.  Pulmonary:     Effort: Pulmonary effort is normal. No respiratory distress.     Breath sounds: Normal breath sounds.     Comments: Mildly tachypneic with no acute distress, no hypoxia. Abdominal:     General: Abdomen is flat. Bowel sounds are normal.     Palpations: Abdomen is soft.      Tenderness: There is no abdominal tenderness.  Musculoskeletal:     Right lower leg: No edema.     Left lower leg: No edema.  Skin:    General: Skin is warm and dry.     Findings: No lesion.  Neurological:     General: No focal deficit present.     Mental Status: She is alert and oriented to person, place, and time. Mental status is at baseline.     (all labs ordered are listed, but only abnormal results are displayed) Labs Reviewed  BASIC METABOLIC PANEL WITH GFR - Abnormal; Notable for the following components:      Result Value   Glucose, Bld 120 (*)    BUN 29 (*)    All other components within normal limits  RESP PANEL BY RT-PCR (RSV, FLU A&B, COVID)  RVPGX2  CBC  PRO BRAIN NATRIURETIC PEPTIDE  TROPONIN T, HIGH SENSITIVITY  TROPONIN T, HIGH SENSITIVITY    EKG: EKG Interpretation Date/Time:  Saturday June 26 2024 11:29:24 EST Ventricular Rate:  82 PR Interval:  165 QRS Duration:  137 QT Interval:  407 QTC Calculation: 476 R Axis:   89  Text Interpretation: Sinus rhythm Right bundle branch block Confirmed by Suzette Pac [phone]) on 06/26/2024 1:59:26 PM  Radiology: CT Angio Chest PE W and/or Wo Contrast Result Date: 06/26/2024 EXAM: CTA CHEST 06/26/2024 01:20:55 PM TECHNIQUE: CTA of the chest was performed after the administration of 75 mL of iohexol  (OMNIPAQUE ) 350 MG/ML injection. Multiplanar reformatted images are provided for review. MIP (Maximum Intensity Projection) images are provided for review. Automated exposure control, iterative reconstruction, and/or weight based adjustment of the mA/kV was utilized to reduce the radiation dose to as low as reasonably achievable. COMPARISON: 01/06/2014 CLINICAL HISTORY: Pulmonary embolism (PE) suspected, high probability. FINDINGS: PULMONARY ARTERIES: Pulmonary arteries are adequately opacified for evaluation. No acute pulmonary embolus. Main pulmonary artery is normal in caliber. MEDIASTINUM: The heart and  pericardium demonstrate no acute abnormality. Diffuse aortic atherosclerosis. There is no acute abnormality of the thoracic aorta. LYMPH NODES: Unchanged mildly enlarged AP (Aortopulmonary) window lymph node, likely reactive. No hilar or axillary lymphadenopathy. LUNGS AND PLEURA: Minimal biapical pleural parenchymal scarring, unchanged. 3 mm anterior right upper lobe nodule (axial 63), unchanged, benign. No focal consolidation or pulmonary edema. No evidence of pleural effusion or pneumothorax. UPPER ABDOMEN: Unchanged right hepatic lobe hemangioma. Partially visualized 4.4 cm hypodensity in the left kidney, likely a partially visualized cyst. SOFT TISSUES AND BONES: Moderate degenerative disc disease in the cervical spine. Osteopenia. Scattered thoracic osteophytosis. No acute bone or soft tissue abnormality. IMPRESSION: 1. No evidence of pulmonary embolism. 2. No pneumonia, pulmonary edema, or pleural effusion. Electronically signed by: Rogelia Myers MD 06/26/2024 01:42 PM  EST RP Workstation: [id number]   DG Chest 2 View Result Date: 06/26/2024 CLINICAL DATA:  Shortness of breath. EXAM: CHEST - 2 VIEW COMPARISON:  Chest radiograph dated 10/25/2016. FINDINGS: No focal consolidation, pleural effusion or pneumothorax. The cardiac silhouette is within normal limits. No acute osseous pathology. IMPRESSION: No active cardiopulmonary disease. Electronically Signed   By: Vanetta Chou M.D.   On: 06/26/2024 12:12     Procedures   Medications Ordered in the ED  albuterol  (VENTOLIN  HFA) 108 (90 Base) MCG/ACT inhaler 1-2 puff (1 puff Inhalation Given 06/26/24 1435)  iohexol  (OMNIPAQUE ) 350 MG/ML injection 75 mL (75 mLs Intravenous Contrast Given 06/26/24 1315)                                    Medical Decision Making Amount and/or Complexity of Data Reviewed Labs: ordered. Radiology: ordered.  Risk Prescription drug management.   This patient presents to the ED for concern of shortness of  breath, this involves an extensive number of treatment options, and is a complaint that carries with it a high risk of complications and morbidity.  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   Lab Tests:  I personally interpreted labs.  The pertinent results include:   CBC, BMP unremarkable.  Respiratory panel is negative.  Troponin is under 15, BNP 296   Imaging Studies ordered:  I ordered imaging studies including chest x-ray I independently visualized and interpreted imaging which showed no acute findings on chest x-ray.  CT angio of the chest PE study no acute findings.   I agree with the radiologist interpretation   Cardiac Monitoring: / EKG:  The patient was maintained on a cardiac monitor.  I personally viewed and interpreted the cardiac monitored which showed an underlying rhythm of: sinus with RBBB   Problem List / ED Course / Critical interventions / Medication management  Patient presents with shortness of breath and fatigue for 2 days.  This started after viral URI-like symptoms.  She denies any chest pain.  Shortness of breath is worse when ambulating.  Her lungs are clear to auscultation with no wheezing and no hypoxia.  She is mildly tachypneic.  Chest x-ray shows no acute findings.  Will obtain CT scan to rule out pulmonary embolism although she is already on Xarelto .  No evidence of fluid overload on exam.  Respiratory panel negative. Will ambulate with pulse ox to evaluate for O2 desaturation with ambulation.  Patient ambulated well with pulse ox and was able to maintain conversation did have some mild tachypnea but no hypoxia. I ordered medication  including albuterol  for SOB Reevaluation of the patient after these medicines showed that the patient improved I have reviewed the patients home medicines and have made adjustments as needed As vital signs are stable and imaging is overall reassuring.  Attending evaluated patient and agrees to plan. Feel stable for discharge with outpatient follow-up.       Final diagnoses:  Shortness of breath    ED Discharge Orders          Ordered    predniSONE  (DELTASONE ) 20 MG tablet  Daily  06/26/24 1421               Shermon Warren SAILOR, PA-C 06/26/24 1503  "

## 2024-06-26 NOTE — Discharge Instructions (Signed)
 Call to schedule appointment with primary care doctor to follow-up on Monday Tuesday of next week.  Return to ER with new or worsening symptoms.  In the meantime make sure you are eating regular meals, stay well-hydrated.  Take 5 days of prednisone .  Use albuterol  as needed for shortness of breath.  Lab work and imaging are overall reassuring today.

## 2024-06-26 NOTE — ED Triage Notes (Signed)
 Pt sent from UC with c/o fatigue, shortness of breath and feelings of heart racing. Pt reports this has been going on for 2 days, has not had any sick contacts
